# Patient Record
Sex: Female | Born: 1935 | Race: White | Hispanic: No | Smoking: Never smoker
Health system: Southern US, Community
[De-identification: ages and names within clinical notes are randomized; demographics above are authoritative.]

## PROBLEM LIST (undated history)

## (undated) DIAGNOSIS — Z8489 Family history of other specified conditions: Secondary | ICD-10-CM

## (undated) DIAGNOSIS — Z950 Presence of cardiac pacemaker: Secondary | ICD-10-CM

## (undated) DIAGNOSIS — I2510 Atherosclerotic heart disease of native coronary artery without angina pectoris: Secondary | ICD-10-CM

## (undated) DIAGNOSIS — K219 Gastro-esophageal reflux disease without esophagitis: Secondary | ICD-10-CM

## (undated) DIAGNOSIS — I10 Essential (primary) hypertension: Secondary | ICD-10-CM

## (undated) HISTORY — PX: PACEMAKER INSERTION: SHX728

## (undated) HISTORY — PX: CARDIAC CATHETERIZATION: SHX172

## (undated) HISTORY — PX: CATARACT EXTRACTION: SUR2

## (undated) HISTORY — PX: LASIK: SHX215

## (undated) HISTORY — PX: TONSILLECTOMY: SUR1361

## (undated) HISTORY — PX: ESOPHAGEAL DILATION: SHX303

## (undated) HISTORY — PX: KNEE ARTHROSCOPY: SUR90

---

## 2004-02-09 LAB — HM PAP SMEAR: HM Pap smear: NORMAL

## 2005-08-08 ENCOUNTER — Ambulatory Visit: Payer: Self-pay | Admitting: Family Medicine

## 2006-02-08 ENCOUNTER — Ambulatory Visit: Payer: Self-pay | Admitting: Specialist

## 2006-04-29 ENCOUNTER — Ambulatory Visit: Payer: Self-pay | Admitting: Unknown Physician Specialty

## 2006-04-29 LAB — HM COLONOSCOPY

## 2006-11-21 ENCOUNTER — Ambulatory Visit: Payer: Self-pay | Admitting: Family Medicine

## 2006-11-21 LAB — HM DEXA SCAN: HM DEXA SCAN: NORMAL

## 2007-05-26 ENCOUNTER — Ambulatory Visit: Payer: Self-pay | Admitting: General Surgery

## 2007-11-27 ENCOUNTER — Ambulatory Visit: Payer: Self-pay | Admitting: General Surgery

## 2009-03-07 ENCOUNTER — Ambulatory Visit: Payer: Self-pay | Admitting: Internal Medicine

## 2009-03-09 HISTORY — PX: CORONARY ARTERY BYPASS GRAFT: SHX141

## 2010-03-01 ENCOUNTER — Ambulatory Visit: Payer: Self-pay | Admitting: Family Medicine

## 2010-04-26 ENCOUNTER — Ambulatory Visit: Payer: Self-pay | Admitting: Family Medicine

## 2010-04-26 LAB — HM MAMMOGRAPHY: HM Mammogram: NORMAL

## 2011-01-31 ENCOUNTER — Ambulatory Visit: Payer: Self-pay | Admitting: Ophthalmology

## 2011-01-31 DIAGNOSIS — I119 Hypertensive heart disease without heart failure: Secondary | ICD-10-CM

## 2011-04-10 ENCOUNTER — Ambulatory Visit: Payer: Self-pay | Admitting: Ophthalmology

## 2011-04-23 ENCOUNTER — Ambulatory Visit: Payer: Self-pay | Admitting: Ophthalmology

## 2011-07-17 ENCOUNTER — Ambulatory Visit: Payer: Self-pay | Admitting: Family Medicine

## 2011-11-26 DIAGNOSIS — N390 Urinary tract infection, site not specified: Secondary | ICD-10-CM | POA: Diagnosis not present

## 2011-12-04 DIAGNOSIS — R5381 Other malaise: Secondary | ICD-10-CM | POA: Diagnosis not present

## 2011-12-04 DIAGNOSIS — R5383 Other fatigue: Secondary | ICD-10-CM | POA: Diagnosis not present

## 2011-12-04 DIAGNOSIS — I2510 Atherosclerotic heart disease of native coronary artery without angina pectoris: Secondary | ICD-10-CM | POA: Diagnosis not present

## 2011-12-04 DIAGNOSIS — I119 Hypertensive heart disease without heart failure: Secondary | ICD-10-CM | POA: Diagnosis not present

## 2011-12-04 DIAGNOSIS — E782 Mixed hyperlipidemia: Secondary | ICD-10-CM | POA: Diagnosis not present

## 2011-12-26 DIAGNOSIS — H40009 Preglaucoma, unspecified, unspecified eye: Secondary | ICD-10-CM | POA: Diagnosis not present

## 2011-12-31 DIAGNOSIS — Z79899 Other long term (current) drug therapy: Secondary | ICD-10-CM | POA: Diagnosis not present

## 2011-12-31 DIAGNOSIS — R5381 Other malaise: Secondary | ICD-10-CM | POA: Diagnosis not present

## 2011-12-31 DIAGNOSIS — E7800 Pure hypercholesterolemia, unspecified: Secondary | ICD-10-CM | POA: Diagnosis not present

## 2012-01-09 ENCOUNTER — Ambulatory Visit: Payer: Self-pay | Admitting: Ophthalmology

## 2012-01-09 DIAGNOSIS — Z01810 Encounter for preprocedural cardiovascular examination: Secondary | ICD-10-CM | POA: Diagnosis not present

## 2012-01-09 DIAGNOSIS — H2510 Age-related nuclear cataract, unspecified eye: Secondary | ICD-10-CM | POA: Diagnosis not present

## 2012-01-09 DIAGNOSIS — I10 Essential (primary) hypertension: Secondary | ICD-10-CM | POA: Diagnosis not present

## 2012-01-09 DIAGNOSIS — Z01812 Encounter for preprocedural laboratory examination: Secondary | ICD-10-CM | POA: Diagnosis not present

## 2012-01-09 LAB — POTASSIUM: Potassium: 3.6 mmol/L (ref 3.5–5.1)

## 2012-01-21 ENCOUNTER — Ambulatory Visit: Payer: Self-pay | Admitting: Ophthalmology

## 2012-01-21 DIAGNOSIS — H269 Unspecified cataract: Secondary | ICD-10-CM | POA: Diagnosis not present

## 2012-01-21 DIAGNOSIS — E7800 Pure hypercholesterolemia, unspecified: Secondary | ICD-10-CM | POA: Diagnosis not present

## 2012-01-21 DIAGNOSIS — I2510 Atherosclerotic heart disease of native coronary artery without angina pectoris: Secondary | ICD-10-CM | POA: Diagnosis not present

## 2012-01-21 DIAGNOSIS — H2510 Age-related nuclear cataract, unspecified eye: Secondary | ICD-10-CM | POA: Diagnosis not present

## 2012-01-21 DIAGNOSIS — Z950 Presence of cardiac pacemaker: Secondary | ICD-10-CM | POA: Diagnosis not present

## 2012-01-21 DIAGNOSIS — I10 Essential (primary) hypertension: Secondary | ICD-10-CM | POA: Diagnosis not present

## 2012-01-21 DIAGNOSIS — Z79899 Other long term (current) drug therapy: Secondary | ICD-10-CM | POA: Diagnosis not present

## 2012-01-21 DIAGNOSIS — K219 Gastro-esophageal reflux disease without esophagitis: Secondary | ICD-10-CM | POA: Diagnosis not present

## 2012-01-21 DIAGNOSIS — M069 Rheumatoid arthritis, unspecified: Secondary | ICD-10-CM | POA: Diagnosis not present

## 2012-01-21 DIAGNOSIS — Z9889 Other specified postprocedural states: Secondary | ICD-10-CM | POA: Diagnosis not present

## 2012-01-21 DIAGNOSIS — G4730 Sleep apnea, unspecified: Secondary | ICD-10-CM | POA: Diagnosis not present

## 2012-01-21 DIAGNOSIS — J45909 Unspecified asthma, uncomplicated: Secondary | ICD-10-CM | POA: Diagnosis not present

## 2012-02-19 DIAGNOSIS — I442 Atrioventricular block, complete: Secondary | ICD-10-CM | POA: Diagnosis not present

## 2012-02-19 DIAGNOSIS — I059 Rheumatic mitral valve disease, unspecified: Secondary | ICD-10-CM | POA: Diagnosis not present

## 2012-02-19 DIAGNOSIS — I2510 Atherosclerotic heart disease of native coronary artery without angina pectoris: Secondary | ICD-10-CM | POA: Diagnosis not present

## 2012-02-19 DIAGNOSIS — E782 Mixed hyperlipidemia: Secondary | ICD-10-CM | POA: Diagnosis not present

## 2012-04-01 DIAGNOSIS — I442 Atrioventricular block, complete: Secondary | ICD-10-CM | POA: Diagnosis not present

## 2012-05-26 DIAGNOSIS — I10 Essential (primary) hypertension: Secondary | ICD-10-CM | POA: Diagnosis not present

## 2012-05-26 DIAGNOSIS — R079 Chest pain, unspecified: Secondary | ICD-10-CM | POA: Diagnosis not present

## 2012-05-26 DIAGNOSIS — R5381 Other malaise: Secondary | ICD-10-CM | POA: Diagnosis not present

## 2012-05-26 DIAGNOSIS — R5383 Other fatigue: Secondary | ICD-10-CM | POA: Diagnosis not present

## 2012-05-26 DIAGNOSIS — N390 Urinary tract infection, site not specified: Secondary | ICD-10-CM | POA: Diagnosis not present

## 2012-08-28 DIAGNOSIS — Z23 Encounter for immunization: Secondary | ICD-10-CM | POA: Diagnosis not present

## 2012-09-02 ENCOUNTER — Ambulatory Visit: Payer: Self-pay | Admitting: Family Medicine

## 2012-09-02 DIAGNOSIS — N390 Urinary tract infection, site not specified: Secondary | ICD-10-CM | POA: Diagnosis not present

## 2012-09-02 DIAGNOSIS — M542 Cervicalgia: Secondary | ICD-10-CM | POA: Diagnosis not present

## 2012-09-02 DIAGNOSIS — M47812 Spondylosis without myelopathy or radiculopathy, cervical region: Secondary | ICD-10-CM | POA: Diagnosis not present

## 2012-09-02 DIAGNOSIS — M5030 Other cervical disc degeneration, unspecified cervical region: Secondary | ICD-10-CM | POA: Diagnosis not present

## 2012-09-02 DIAGNOSIS — K429 Umbilical hernia without obstruction or gangrene: Secondary | ICD-10-CM | POA: Diagnosis not present

## 2012-09-02 DIAGNOSIS — M549 Dorsalgia, unspecified: Secondary | ICD-10-CM | POA: Diagnosis not present

## 2012-09-16 DIAGNOSIS — I209 Angina pectoris, unspecified: Secondary | ICD-10-CM | POA: Diagnosis not present

## 2012-09-16 DIAGNOSIS — I10 Essential (primary) hypertension: Secondary | ICD-10-CM | POA: Diagnosis not present

## 2012-09-16 DIAGNOSIS — I2510 Atherosclerotic heart disease of native coronary artery without angina pectoris: Secondary | ICD-10-CM | POA: Diagnosis not present

## 2012-09-16 DIAGNOSIS — E782 Mixed hyperlipidemia: Secondary | ICD-10-CM | POA: Diagnosis not present

## 2012-09-23 DIAGNOSIS — I442 Atrioventricular block, complete: Secondary | ICD-10-CM | POA: Diagnosis not present

## 2012-09-23 DIAGNOSIS — I209 Angina pectoris, unspecified: Secondary | ICD-10-CM | POA: Diagnosis not present

## 2012-09-30 DIAGNOSIS — I10 Essential (primary) hypertension: Secondary | ICD-10-CM | POA: Diagnosis not present

## 2012-09-30 DIAGNOSIS — E782 Mixed hyperlipidemia: Secondary | ICD-10-CM | POA: Diagnosis not present

## 2012-09-30 DIAGNOSIS — I25810 Atherosclerosis of coronary artery bypass graft(s) without angina pectoris: Secondary | ICD-10-CM | POA: Diagnosis not present

## 2012-09-30 DIAGNOSIS — I059 Rheumatic mitral valve disease, unspecified: Secondary | ICD-10-CM | POA: Diagnosis not present

## 2012-10-13 ENCOUNTER — Ambulatory Visit: Payer: Self-pay | Admitting: Family Medicine

## 2012-10-13 DIAGNOSIS — Z1231 Encounter for screening mammogram for malignant neoplasm of breast: Secondary | ICD-10-CM | POA: Diagnosis not present

## 2012-10-27 DIAGNOSIS — E7800 Pure hypercholesterolemia, unspecified: Secondary | ICD-10-CM | POA: Diagnosis not present

## 2012-10-27 DIAGNOSIS — I2510 Atherosclerotic heart disease of native coronary artery without angina pectoris: Secondary | ICD-10-CM | POA: Diagnosis not present

## 2012-10-27 DIAGNOSIS — F329 Major depressive disorder, single episode, unspecified: Secondary | ICD-10-CM | POA: Diagnosis not present

## 2012-10-27 DIAGNOSIS — I10 Essential (primary) hypertension: Secondary | ICD-10-CM | POA: Diagnosis not present

## 2012-10-30 DIAGNOSIS — E7800 Pure hypercholesterolemia, unspecified: Secondary | ICD-10-CM | POA: Diagnosis not present

## 2012-10-30 DIAGNOSIS — Z79899 Other long term (current) drug therapy: Secondary | ICD-10-CM | POA: Diagnosis not present

## 2012-10-30 DIAGNOSIS — I10 Essential (primary) hypertension: Secondary | ICD-10-CM | POA: Diagnosis not present

## 2012-10-30 DIAGNOSIS — R5381 Other malaise: Secondary | ICD-10-CM | POA: Diagnosis not present

## 2012-11-20 DIAGNOSIS — H40009 Preglaucoma, unspecified, unspecified eye: Secondary | ICD-10-CM | POA: Diagnosis not present

## 2013-02-04 DIAGNOSIS — D235 Other benign neoplasm of skin of trunk: Secondary | ICD-10-CM | POA: Diagnosis not present

## 2013-02-04 DIAGNOSIS — L723 Sebaceous cyst: Secondary | ICD-10-CM | POA: Diagnosis not present

## 2013-02-04 DIAGNOSIS — L821 Other seborrheic keratosis: Secondary | ICD-10-CM | POA: Diagnosis not present

## 2013-04-08 DIAGNOSIS — I119 Hypertensive heart disease without heart failure: Secondary | ICD-10-CM | POA: Diagnosis not present

## 2013-04-08 DIAGNOSIS — E782 Mixed hyperlipidemia: Secondary | ICD-10-CM | POA: Diagnosis not present

## 2013-04-08 DIAGNOSIS — I495 Sick sinus syndrome: Secondary | ICD-10-CM | POA: Diagnosis not present

## 2013-04-08 DIAGNOSIS — I2510 Atherosclerotic heart disease of native coronary artery without angina pectoris: Secondary | ICD-10-CM | POA: Diagnosis not present

## 2013-04-09 DIAGNOSIS — I442 Atrioventricular block, complete: Secondary | ICD-10-CM | POA: Diagnosis not present

## 2013-05-13 DIAGNOSIS — E7800 Pure hypercholesterolemia, unspecified: Secondary | ICD-10-CM | POA: Diagnosis not present

## 2013-05-13 DIAGNOSIS — I10 Essential (primary) hypertension: Secondary | ICD-10-CM | POA: Diagnosis not present

## 2013-05-13 DIAGNOSIS — R002 Palpitations: Secondary | ICD-10-CM | POA: Diagnosis not present

## 2013-05-13 DIAGNOSIS — M129 Arthropathy, unspecified: Secondary | ICD-10-CM | POA: Diagnosis not present

## 2013-05-19 DIAGNOSIS — H26499 Other secondary cataract, unspecified eye: Secondary | ICD-10-CM | POA: Diagnosis not present

## 2013-05-21 DIAGNOSIS — M1610 Unilateral primary osteoarthritis, unspecified hip: Secondary | ICD-10-CM | POA: Diagnosis not present

## 2013-09-15 DIAGNOSIS — I10 Essential (primary) hypertension: Secondary | ICD-10-CM | POA: Diagnosis not present

## 2013-09-15 DIAGNOSIS — I2510 Atherosclerotic heart disease of native coronary artery without angina pectoris: Secondary | ICD-10-CM | POA: Diagnosis not present

## 2013-09-15 DIAGNOSIS — E785 Hyperlipidemia, unspecified: Secondary | ICD-10-CM | POA: Diagnosis not present

## 2013-09-15 DIAGNOSIS — Z950 Presence of cardiac pacemaker: Secondary | ICD-10-CM | POA: Diagnosis not present

## 2013-09-17 DIAGNOSIS — Z23 Encounter for immunization: Secondary | ICD-10-CM | POA: Diagnosis not present

## 2013-09-23 ENCOUNTER — Ambulatory Visit: Payer: Self-pay | Admitting: Family Medicine

## 2013-09-23 DIAGNOSIS — M5030 Other cervical disc degeneration, unspecified cervical region: Secondary | ICD-10-CM | POA: Diagnosis not present

## 2013-09-23 DIAGNOSIS — E7800 Pure hypercholesterolemia, unspecified: Secondary | ICD-10-CM | POA: Diagnosis not present

## 2013-09-23 DIAGNOSIS — M129 Arthropathy, unspecified: Secondary | ICD-10-CM | POA: Diagnosis not present

## 2013-09-23 DIAGNOSIS — I10 Essential (primary) hypertension: Secondary | ICD-10-CM | POA: Diagnosis not present

## 2013-09-23 DIAGNOSIS — M542 Cervicalgia: Secondary | ICD-10-CM | POA: Diagnosis not present

## 2013-09-23 DIAGNOSIS — Z0000 Encounter for general adult medical examination without abnormal findings: Secondary | ICD-10-CM | POA: Diagnosis not present

## 2013-09-29 DIAGNOSIS — I442 Atrioventricular block, complete: Secondary | ICD-10-CM | POA: Diagnosis not present

## 2013-10-20 ENCOUNTER — Ambulatory Visit: Payer: Self-pay | Admitting: Family Medicine

## 2013-10-20 DIAGNOSIS — Z1231 Encounter for screening mammogram for malignant neoplasm of breast: Secondary | ICD-10-CM | POA: Diagnosis not present

## 2013-10-26 DIAGNOSIS — M129 Arthropathy, unspecified: Secondary | ICD-10-CM | POA: Diagnosis not present

## 2013-10-26 DIAGNOSIS — I10 Essential (primary) hypertension: Secondary | ICD-10-CM | POA: Diagnosis not present

## 2013-10-26 DIAGNOSIS — N390 Urinary tract infection, site not specified: Secondary | ICD-10-CM | POA: Diagnosis not present

## 2013-10-26 DIAGNOSIS — E7800 Pure hypercholesterolemia, unspecified: Secondary | ICD-10-CM | POA: Diagnosis not present

## 2013-10-28 DIAGNOSIS — N318 Other neuromuscular dysfunction of bladder: Secondary | ICD-10-CM | POA: Diagnosis not present

## 2013-10-28 DIAGNOSIS — I10 Essential (primary) hypertension: Secondary | ICD-10-CM | POA: Diagnosis not present

## 2013-10-28 DIAGNOSIS — IMO0002 Reserved for concepts with insufficient information to code with codable children: Secondary | ICD-10-CM | POA: Diagnosis not present

## 2013-10-28 DIAGNOSIS — E7800 Pure hypercholesterolemia, unspecified: Secondary | ICD-10-CM | POA: Diagnosis not present

## 2013-11-05 DIAGNOSIS — L299 Pruritus, unspecified: Secondary | ICD-10-CM | POA: Diagnosis not present

## 2013-11-26 DIAGNOSIS — M129 Arthropathy, unspecified: Secondary | ICD-10-CM | POA: Diagnosis not present

## 2013-11-26 DIAGNOSIS — I10 Essential (primary) hypertension: Secondary | ICD-10-CM | POA: Diagnosis not present

## 2013-11-26 DIAGNOSIS — E7800 Pure hypercholesterolemia, unspecified: Secondary | ICD-10-CM | POA: Diagnosis not present

## 2013-11-26 DIAGNOSIS — N390 Urinary tract infection, site not specified: Secondary | ICD-10-CM | POA: Diagnosis not present

## 2014-01-27 DIAGNOSIS — I442 Atrioventricular block, complete: Secondary | ICD-10-CM | POA: Insufficient documentation

## 2014-01-27 DIAGNOSIS — Z950 Presence of cardiac pacemaker: Secondary | ICD-10-CM | POA: Insufficient documentation

## 2014-02-02 DIAGNOSIS — L821 Other seborrheic keratosis: Secondary | ICD-10-CM | POA: Diagnosis not present

## 2014-02-02 DIAGNOSIS — L259 Unspecified contact dermatitis, unspecified cause: Secondary | ICD-10-CM | POA: Diagnosis not present

## 2014-03-30 DIAGNOSIS — I25810 Atherosclerosis of coronary artery bypass graft(s) without angina pectoris: Secondary | ICD-10-CM | POA: Diagnosis not present

## 2014-03-30 DIAGNOSIS — I442 Atrioventricular block, complete: Secondary | ICD-10-CM | POA: Diagnosis not present

## 2014-03-30 DIAGNOSIS — I10 Essential (primary) hypertension: Secondary | ICD-10-CM | POA: Diagnosis not present

## 2014-03-30 DIAGNOSIS — K219 Gastro-esophageal reflux disease without esophagitis: Secondary | ICD-10-CM | POA: Diagnosis not present

## 2014-05-16 DIAGNOSIS — B029 Zoster without complications: Secondary | ICD-10-CM | POA: Diagnosis not present

## 2014-06-03 DIAGNOSIS — B029 Zoster without complications: Secondary | ICD-10-CM | POA: Diagnosis not present

## 2014-06-03 DIAGNOSIS — I10 Essential (primary) hypertension: Secondary | ICD-10-CM | POA: Diagnosis not present

## 2014-06-03 DIAGNOSIS — E7800 Pure hypercholesterolemia, unspecified: Secondary | ICD-10-CM | POA: Diagnosis not present

## 2014-06-03 DIAGNOSIS — I2510 Atherosclerotic heart disease of native coronary artery without angina pectoris: Secondary | ICD-10-CM | POA: Diagnosis not present

## 2014-07-15 DIAGNOSIS — H26499 Other secondary cataract, unspecified eye: Secondary | ICD-10-CM | POA: Diagnosis not present

## 2014-08-26 DIAGNOSIS — Z23 Encounter for immunization: Secondary | ICD-10-CM | POA: Diagnosis not present

## 2014-09-30 DIAGNOSIS — E782 Mixed hyperlipidemia: Secondary | ICD-10-CM | POA: Diagnosis not present

## 2014-09-30 DIAGNOSIS — I340 Nonrheumatic mitral (valve) insufficiency: Secondary | ICD-10-CM | POA: Insufficient documentation

## 2014-09-30 DIAGNOSIS — I442 Atrioventricular block, complete: Secondary | ICD-10-CM | POA: Diagnosis not present

## 2014-09-30 DIAGNOSIS — I25709 Atherosclerosis of coronary artery bypass graft(s), unspecified, with unspecified angina pectoris: Secondary | ICD-10-CM | POA: Diagnosis not present

## 2014-09-30 DIAGNOSIS — G4733 Obstructive sleep apnea (adult) (pediatric): Secondary | ICD-10-CM | POA: Insufficient documentation

## 2014-09-30 DIAGNOSIS — Z950 Presence of cardiac pacemaker: Secondary | ICD-10-CM | POA: Diagnosis not present

## 2014-09-30 DIAGNOSIS — I10 Essential (primary) hypertension: Secondary | ICD-10-CM | POA: Diagnosis not present

## 2014-10-04 DIAGNOSIS — F329 Major depressive disorder, single episode, unspecified: Secondary | ICD-10-CM | POA: Diagnosis not present

## 2014-10-04 DIAGNOSIS — R413 Other amnesia: Secondary | ICD-10-CM | POA: Diagnosis not present

## 2014-10-04 DIAGNOSIS — R5383 Other fatigue: Secondary | ICD-10-CM | POA: Diagnosis not present

## 2014-10-04 DIAGNOSIS — I10 Essential (primary) hypertension: Secondary | ICD-10-CM | POA: Diagnosis not present

## 2014-10-04 DIAGNOSIS — E780 Pure hypercholesterolemia: Secondary | ICD-10-CM | POA: Diagnosis not present

## 2014-10-04 DIAGNOSIS — M810 Age-related osteoporosis without current pathological fracture: Secondary | ICD-10-CM | POA: Diagnosis not present

## 2014-10-04 DIAGNOSIS — Z23 Encounter for immunization: Secondary | ICD-10-CM | POA: Diagnosis not present

## 2014-10-04 LAB — CBC AND DIFFERENTIAL
HEMATOCRIT: 37 % (ref 36–46)
Hemoglobin: 12.9 g/dL (ref 12.0–16.0)
Neutrophils Absolute: 3 /uL
Platelets: 292 10*3/uL (ref 150–399)
WBC: 6.3 10^3/mL

## 2014-10-04 LAB — BASIC METABOLIC PANEL
BUN: 15 mg/dL (ref 4–21)
Creatinine: 0.5 mg/dL (ref 0.5–1.1)
Glucose: 101 mg/dL
POTASSIUM: 3.7 mmol/L (ref 3.4–5.3)
Sodium: 141 mmol/L (ref 137–147)

## 2014-10-04 LAB — LIPID PANEL
CHOLESTEROL: 188 mg/dL (ref 0–200)
HDL: 75 mg/dL — AB (ref 35–70)
LDL CALC: 90 mg/dL
LDL/HDL RATIO: 1.2
Triglycerides: 116 mg/dL (ref 40–160)

## 2014-10-04 LAB — HEPATIC FUNCTION PANEL
ALT: 18 U/L (ref 7–35)
AST: 24 U/L (ref 13–35)
Alkaline Phosphatase: 66 U/L (ref 25–125)

## 2014-10-04 LAB — TSH: TSH: 0.96 u[IU]/mL (ref 0.41–5.90)

## 2014-10-27 DIAGNOSIS — E782 Mixed hyperlipidemia: Secondary | ICD-10-CM | POA: Diagnosis not present

## 2014-10-27 DIAGNOSIS — Z950 Presence of cardiac pacemaker: Secondary | ICD-10-CM | POA: Diagnosis not present

## 2014-10-27 DIAGNOSIS — I25810 Atherosclerosis of coronary artery bypass graft(s) without angina pectoris: Secondary | ICD-10-CM | POA: Diagnosis not present

## 2014-10-27 DIAGNOSIS — I340 Nonrheumatic mitral (valve) insufficiency: Secondary | ICD-10-CM | POA: Diagnosis not present

## 2014-10-27 DIAGNOSIS — I10 Essential (primary) hypertension: Secondary | ICD-10-CM | POA: Diagnosis not present

## 2014-10-27 DIAGNOSIS — R0789 Other chest pain: Secondary | ICD-10-CM | POA: Diagnosis not present

## 2014-11-02 ENCOUNTER — Ambulatory Visit: Payer: Self-pay | Admitting: Family Medicine

## 2014-11-02 DIAGNOSIS — Z1231 Encounter for screening mammogram for malignant neoplasm of breast: Secondary | ICD-10-CM | POA: Diagnosis not present

## 2015-01-26 DIAGNOSIS — Z961 Presence of intraocular lens: Secondary | ICD-10-CM | POA: Diagnosis not present

## 2015-01-26 DIAGNOSIS — H26493 Other secondary cataract, bilateral: Secondary | ICD-10-CM | POA: Diagnosis not present

## 2015-02-03 DIAGNOSIS — L728 Other follicular cysts of the skin and subcutaneous tissue: Secondary | ICD-10-CM | POA: Diagnosis not present

## 2015-02-03 DIAGNOSIS — L821 Other seborrheic keratosis: Secondary | ICD-10-CM | POA: Diagnosis not present

## 2015-03-13 NOTE — Op Note (Signed)
PATIENT NAME:  Melanie Franklin, Melanie Franklin MR#:  [id number] DATE OF BIRTH:  1929-04-11  DATE OF PROCEDURE:  01/21/2012  PREOPERATIVE DIAGNOSIS: Cataract, right eye.   POSTOPERATIVE DIAGNOSIS: Cataract, right eye.   PROCEDURE PERFORMED: Extracapsular cataract extraction using phacoemulsification with placement of an Alcon [id number], 17.5-diopter posterior chamber lens, serial #[id number].   SURGEON: Loura Back. Lalana Wachter, M.D.   ASSISTANT: None.   ANESTHESIA: 4% lidocaine and 0.75% Marcaine in a 50-50 mixture with 10 units/mL of Vitrase added, given as a peribulbar.   ANESTHESIOLOGIST: Dr. Benjamine Mola  COMPLICATIONS: None.   ESTIMATED BLOOD LOSS: Less than 1 mL.   DESCRIPTION OF PROCEDURE:  The patient was brought to the operating room and given a peribulbar block.  The patient was then prepped and draped in the usual fashion.  The vertical rectus muscles were imbricated using 5-0 silk sutures.  These sutures were then clamped to the sterile drapes as bridle sutures.  A limbal peritomy was performed extending two clock hours and hemostasis was obtained with cautery.  A partial thickness scleral groove was made at the surgical limbus and dissected anteriorly in a lamellar dissection using an Alcon crescent knife.  The anterior chamber was entered superonasally with a Superblade and through the lamellar dissection with a 2.6 mm keratome.  DisCoVisc was used to replace the aqueous and a continuous tear capsulorrhexis was carried out.  Hydrodissection and hydrodelineation were carried out with balanced salt and a 27 gauge canula.  The nucleus was rotated to confirm the effectiveness of the hydrodissection.  Phacoemulsification was carried out using a divide-and-conquer technique.  Total ultrasound time was 3 minutes and 16 seconds with an average power of 25.7 percent.  CDE 72.67.  Irrigation/aspiration was used to remove the residual cortex.  DisCoVisc was used to inflate the capsule and the internal incision was  enlarged to 3 mm with the crescent knife.  The intraocular lens was folded and inserted into the capsular bag using the Acrysert delivery system.  Irrigation/aspiration was used to remove the residual DisCoVisc.  Miostat was injected into the anterior chamber through the paracentesis track to inflate the anterior chamber and induce miosis.  The wound was checked for leaks and none were found. The conjunctiva was closed with cautery and the bridle sutures were removed.  Two drops of 0.3% Vigamox were placed on the eye.   An eye shield was placed on the eye.  The patient was discharged to the recovery room in good condition.   ____________________________ Loura Back Cataleya Cristina, MD sad:bjt D: 01/21/2012 13:09:04 ET T: 01/21/2012 13:47:42 ET JOB#: [id number]  cc: Remo Lipps A. Breean Nannini, MD, <Dictator> Martie Lee MD ELECTRONICALLY SIGNED 01/22/2012 14:18

## 2015-03-15 DIAGNOSIS — Z23 Encounter for immunization: Secondary | ICD-10-CM | POA: Diagnosis not present

## 2015-03-15 DIAGNOSIS — R413 Other amnesia: Secondary | ICD-10-CM | POA: Diagnosis not present

## 2015-03-15 DIAGNOSIS — Z1389 Encounter for screening for other disorder: Secondary | ICD-10-CM | POA: Diagnosis not present

## 2015-03-15 DIAGNOSIS — M810 Age-related osteoporosis without current pathological fracture: Secondary | ICD-10-CM | POA: Diagnosis not present

## 2015-03-15 DIAGNOSIS — Z0000 Encounter for general adult medical examination without abnormal findings: Secondary | ICD-10-CM | POA: Diagnosis not present

## 2015-03-15 DIAGNOSIS — F329 Major depressive disorder, single episode, unspecified: Secondary | ICD-10-CM | POA: Diagnosis not present

## 2015-04-28 DIAGNOSIS — I442 Atrioventricular block, complete: Secondary | ICD-10-CM | POA: Diagnosis not present

## 2015-05-04 DIAGNOSIS — G4733 Obstructive sleep apnea (adult) (pediatric): Secondary | ICD-10-CM | POA: Diagnosis not present

## 2015-05-04 DIAGNOSIS — I872 Venous insufficiency (chronic) (peripheral): Secondary | ICD-10-CM | POA: Diagnosis not present

## 2015-05-04 DIAGNOSIS — I071 Rheumatic tricuspid insufficiency: Secondary | ICD-10-CM | POA: Insufficient documentation

## 2015-05-04 DIAGNOSIS — I25708 Atherosclerosis of coronary artery bypass graft(s), unspecified, with other forms of angina pectoris: Secondary | ICD-10-CM | POA: Diagnosis not present

## 2015-05-18 DIAGNOSIS — I10 Essential (primary) hypertension: Secondary | ICD-10-CM | POA: Insufficient documentation

## 2015-05-18 DIAGNOSIS — M5030 Other cervical disc degeneration, unspecified cervical region: Secondary | ICD-10-CM | POA: Insufficient documentation

## 2015-05-18 DIAGNOSIS — M1990 Unspecified osteoarthritis, unspecified site: Secondary | ICD-10-CM | POA: Insufficient documentation

## 2015-05-18 DIAGNOSIS — G5600 Carpal tunnel syndrome, unspecified upper limb: Secondary | ICD-10-CM | POA: Insufficient documentation

## 2015-05-18 DIAGNOSIS — N3281 Overactive bladder: Secondary | ICD-10-CM | POA: Insufficient documentation

## 2015-05-18 DIAGNOSIS — I498 Other specified cardiac arrhythmias: Secondary | ICD-10-CM | POA: Insufficient documentation

## 2015-05-18 DIAGNOSIS — B029 Zoster without complications: Secondary | ICD-10-CM | POA: Insufficient documentation

## 2015-05-18 DIAGNOSIS — F329 Major depressive disorder, single episode, unspecified: Secondary | ICD-10-CM | POA: Insufficient documentation

## 2015-05-18 DIAGNOSIS — G4730 Sleep apnea, unspecified: Secondary | ICD-10-CM | POA: Insufficient documentation

## 2015-05-18 DIAGNOSIS — F32A Depression, unspecified: Secondary | ICD-10-CM | POA: Insufficient documentation

## 2015-05-18 DIAGNOSIS — G2581 Restless legs syndrome: Secondary | ICD-10-CM | POA: Insufficient documentation

## 2015-05-18 DIAGNOSIS — K589 Irritable bowel syndrome without diarrhea: Secondary | ICD-10-CM | POA: Insufficient documentation

## 2015-05-18 DIAGNOSIS — E7800 Pure hypercholesterolemia, unspecified: Secondary | ICD-10-CM | POA: Insufficient documentation

## 2015-05-18 DIAGNOSIS — K429 Umbilical hernia without obstruction or gangrene: Secondary | ICD-10-CM | POA: Insufficient documentation

## 2015-05-18 DIAGNOSIS — K219 Gastro-esophageal reflux disease without esophagitis: Secondary | ICD-10-CM | POA: Insufficient documentation

## 2015-05-18 DIAGNOSIS — I2510 Atherosclerotic heart disease of native coronary artery without angina pectoris: Secondary | ICD-10-CM | POA: Insufficient documentation

## 2015-05-18 DIAGNOSIS — M19049 Primary osteoarthritis, unspecified hand: Secondary | ICD-10-CM | POA: Insufficient documentation

## 2015-05-24 DIAGNOSIS — H3562 Retinal hemorrhage, left eye: Secondary | ICD-10-CM | POA: Diagnosis not present

## 2015-07-11 ENCOUNTER — Ambulatory Visit (INDEPENDENT_AMBULATORY_CARE_PROVIDER_SITE_OTHER): Payer: Medicare Other | Admitting: Family Medicine

## 2015-07-11 ENCOUNTER — Encounter: Payer: Self-pay | Admitting: Family Medicine

## 2015-07-11 VITALS — BP 148/60 | HR 78 | Temp 97.8°F | Resp 16 | Wt 137.0 lb

## 2015-07-11 DIAGNOSIS — I10 Essential (primary) hypertension: Secondary | ICD-10-CM | POA: Diagnosis not present

## 2015-07-11 DIAGNOSIS — K219 Gastro-esophageal reflux disease without esophagitis: Secondary | ICD-10-CM | POA: Diagnosis not present

## 2015-07-11 DIAGNOSIS — R413 Other amnesia: Secondary | ICD-10-CM | POA: Diagnosis not present

## 2015-07-11 MED ORDER — OMEPRAZOLE 20 MG PO CPDR: 20.0000 mg | DELAYED_RELEASE_CAPSULE | Freq: Every day | ORAL | Status: DC

## 2015-07-11 NOTE — Progress Notes (Signed)
Patient ID: Melanie Franklin, female   DOB: [birth year], 81 y.o.   MRN: [id number]    Subjective:  HPI  Hypertension, follow-up:  BP Readings from Last 3 Encounters:  07/11/15 148/60  03/15/15 152/64    She was last seen for hypertension 6 months ago.  BP at that visit was 152/64. Management changes since that visit include none. She reports good compliance with treatment. She is not having side effects.  She is exercising, yard work and stays busy. She is adherent to low salt diet.   Outside blood pressures are not being checked regularly. She is experiencing none.  Patient denies none.       Wt Readings from Last 3 Encounters:  07/11/15 137 lb (62.143 kg)  03/15/15 139 lb (63.05 kg)   ------------------------------------------------------------------------  According to LOV note pt scored a 10 on her CIT 6. Needed to be addressed on this OV. Pt reports that her memory is ok for a 81 year old women, it is not any worse or any better.  Cognitive Testing - 6-CIT  Correct? Score   What year is it? yes 0 0 or 4  What month is it? yes 0 0 or 3  Memorize:    Melanie Franklin,  44,  [place],      What time is it? (within 1 hour) yes 0 0 or 3  Count backwards from 20 yes 0 0, 2, or 4  Name the months of the year yes 0 0, 2, or 4  Repeat name & address above no 6 0, 2, 4, 6, 8, or 10       TOTAL SCORE  6/28   Interpretation:  Normal  Normal (0-7) Abnormal (8-28)        Prior to Admission medications   Medication Sig Start Date End Date Taking? Authorizing Provider  acetaminophen (TYLENOL) 650 MG CR tablet Take by mouth.   Yes Historical Provider, MD  amLODipine (NORVASC) 5 MG tablet Take by mouth. 03/29/15  Yes Historical Provider, MD  aspirin 81 MG tablet Take by mouth. 09/18/11  Yes Historical Provider, MD  Calcium-Magnesium-Vitamin D (CALCIUM 500 PO) Take by mouth.   Yes Historical Provider, MD  magnesium oxide (MAG-OX) 400 MG tablet Take by mouth. 09/18/11  Yes  Historical Provider, MD  Misc Natural Products (GLUCOSAMINE CHONDROITIN COMPLX) TABS Take by mouth.   Yes Historical Provider, MD  MULTIPLE VITAMINS PO Take by mouth. 09/18/11  Yes Historical Provider, MD  omeprazole (PRILOSEC) 20 MG capsule Take by mouth. 09/18/11  Yes Historical Provider, MD  pravastatin (PRAVACHOL) 40 MG tablet Take by mouth. 04/04/15  Yes Historical Provider, MD  quinapril-hydrochlorothiazide (ACCURETIC) 20-12.5 MG per tablet Take by mouth. 01/25/15  Yes Historical Provider, MD    Patient Active Problem List   Diagnosis Date Noted  . Arthritis 05/18/2015  . Arthropathy of hand 05/18/2015  . CAD in native artery 05/18/2015  . Carpal tunnel syndrome 05/18/2015  . DDD (degenerative disc disease), cervical 05/18/2015  . Clinical depression 05/18/2015  . Gastro-esophageal reflux disease without esophagitis 05/18/2015  . Essential (primary) hypertension 05/18/2015  . Herpes zona 05/18/2015  . Adaptive colitis 05/18/2015  . Hypercholesteremia 05/18/2015  . 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 Fluttering heart 05/18/2015  . Restless leg 05/18/2015  . Apnea, sleep 05/18/2015  . Umbilical hernia without obstruction and without gangrene 05/18/2015  . TI (tricuspid incompetence) 05/04/2015  . Obstructive apnea 09/30/2014  . MI (mitral incompetence) 09/30/2014  . Artificial  cardiac pacemaker 01/27/2014  . Acquired complete AV block 01/27/2014    History reviewed. No pertinent past medical history.  Social History   Social History  . Marital Status: Married    Spouse Name: N/A  . Number of Children: N/A  . Years of Education: N/A   Occupational History  . Not on file.   Social History Main Topics  . Smoking status: Never Smoker   . Smokeless tobacco: Not on file  . Alcohol Use: No  . Drug Use: No  . Sexual Activity: Not on file   Other Topics Concern  . Not on file   Social History Narrative    Allergies  Allergen Reactions  . Ciprofloxacin   .  Meloxicam   . Amoxicillin Swelling and Rash  . Entex Lq  [Phenylephrine-Guaifenesin] Swelling and Rash    Review of Systems  Constitutional: Positive for malaise/fatigue.  HENT: Negative.   Eyes: Negative.   Respiratory: Negative.   Cardiovascular: Positive for chest pain (every now and then. She has checked by her Cardiologist since it started.).  Gastrointestinal: Negative.   Genitourinary: Negative.   Musculoskeletal: Negative.   Skin: Negative.   Neurological: Negative.   Endo/Heme/Allergies: Negative.   Psychiatric/Behavioral: Negative.     Immunization History  Administered Date(s) Administered  . Pneumococcal Conjugate-13 10/04/2014  . Pneumococcal Polysaccharide-23 09/22/2005  . Td 02/09/2004  . Zoster 08/09/2008   Objective:  BP 148/60 mmHg  Pulse 78  Temp(Src) 97.8 F (36.6 C) (Oral)  Resp 16  Wt 137 lb (62.143 kg)  Physical Exam  Constitutional: She is oriented to person, place, and time and well-developed, well-nourished, and in no distress.  HENT:  Head: Normocephalic and atraumatic.  Right Ear: External ear normal.  Left Ear: External ear normal.  Nose: Nose normal.  Eyes: Conjunctivae and EOM are normal. Pupils are equal, round, and reactive to light.  Neck: Normal range of motion. Neck supple.  Cardiovascular: Normal rate, regular rhythm, normal heart sounds and intact distal pulses.   Pulmonary/Chest: Effort normal and breath sounds normal.  Abdominal: Soft. Bowel sounds are normal.  Neurological: She is alert and oriented to person, place, and time. She has normal reflexes. Gait normal. GCS score is 15.  Skin: Skin is warm and dry.  Psychiatric: Mood, memory, affect and judgment normal.    Lab Results  Component Value Date   WBC 6.3 10/04/2014   HGB 12.9 10/04/2014   HCT 37 10/04/2014   PLT 292 10/04/2014   CHOL 188 10/04/2014   TRIG 116 10/04/2014   HDL 75* 10/04/2014   LDLCALC 90 10/04/2014   TSH 0.96 10/04/2014    CMP       Component Value Date/Time   NA 141 10/04/2014   K 3.7 10/04/2014   K 3.6 01/09/2012 1010   BUN 15 10/04/2014   CREATININE 0.5 10/04/2014   AST 24 10/04/2014   ALT 18 10/04/2014   ALKPHOS 66 10/04/2014    Assessment and Plan :  ASSESSMENT: 1. Gastro-esophageal reflux disease without esophagitis  - omeprazole (PRILOSEC) 20 MG capsule; Take 1 capsule (20 mg total) by mouth daily.  Dispense: 30 capsule; Refill: 12  2. Memory loss or impairment She score 6/28 today which is improved from last visit. I think she is having some age-related memory loss but this is mild. She just driving and cooking. He is not having problems with getting lost or with leaving the oven on. Of note is she is taking care of her sister  who has oral cancer and is recovering. This is been going on for 4 months and the patient feels good about this.  3. Essential (primary) hypertension 4. CAD without angina All risk factors treated. Patient was seen and examined by Dr. Miguel Aschoff, and noted scribed by Webb Laws, [place] MD [facility] 07/11/2015 9:39 AM

## 2015-08-01 DIAGNOSIS — Z23 Encounter for immunization: Secondary | ICD-10-CM | POA: Diagnosis not present

## 2015-08-23 DIAGNOSIS — H3562 Retinal hemorrhage, left eye: Secondary | ICD-10-CM | POA: Diagnosis not present

## 2015-09-21 ENCOUNTER — Emergency Department
Admission: EM | Admit: 2015-09-21 | Discharge: 2015-09-21 | Disposition: A | Discharge status: Home / Self Care | Payer: Medicare Other | Attending: Emergency Medicine | Admitting: Emergency Medicine

## 2015-09-21 ENCOUNTER — Emergency Department: Payer: Medicare Other

## 2015-09-21 DIAGNOSIS — Z7982 Long term (current) use of aspirin: Secondary | ICD-10-CM | POA: Diagnosis not present

## 2015-09-21 DIAGNOSIS — S42292A Other displaced fracture of upper end of left humerus, initial encounter for closed fracture: Secondary | ICD-10-CM | POA: Insufficient documentation

## 2015-09-21 DIAGNOSIS — I10 Essential (primary) hypertension: Secondary | ICD-10-CM | POA: Diagnosis not present

## 2015-09-21 DIAGNOSIS — S42202A Unspecified fracture of upper end of left humerus, initial encounter for closed fracture: Secondary | ICD-10-CM

## 2015-09-21 DIAGNOSIS — Y998 Other external cause status: Secondary | ICD-10-CM | POA: Insufficient documentation

## 2015-09-21 DIAGNOSIS — Y92009 Unspecified place in unspecified non-institutional (private) residence as the place of occurrence of the external cause: Secondary | ICD-10-CM | POA: Diagnosis not present

## 2015-09-21 DIAGNOSIS — Y9389 Activity, other specified: Secondary | ICD-10-CM | POA: Diagnosis not present

## 2015-09-21 DIAGNOSIS — W109XXA Fall (on) (from) unspecified stairs and steps, initial encounter: Secondary | ICD-10-CM | POA: Diagnosis not present

## 2015-09-21 DIAGNOSIS — Z880 Allergy status to penicillin: Secondary | ICD-10-CM | POA: Diagnosis not present

## 2015-09-21 DIAGNOSIS — Z79899 Other long term (current) drug therapy: Secondary | ICD-10-CM | POA: Insufficient documentation

## 2015-09-21 DIAGNOSIS — S4992XA Unspecified injury of left shoulder and upper arm, initial encounter: Secondary | ICD-10-CM | POA: Diagnosis present

## 2015-09-21 MED ORDER — TRAMADOL HCL 50 MG PO TABS: 50.0000 mg | ORAL_TABLET | Freq: Four times a day (QID) | ORAL | Status: DC | PRN

## 2015-09-21 MED ORDER — TRAMADOL HCL 50 MG PO TABS
ORAL_TABLET | ORAL | Status: AC
  Filled 2015-09-21: qty 1

## 2015-09-21 MED ORDER — TRAMADOL HCL 50 MG PO TABS
50.0000 mg | ORAL_TABLET | Freq: Once | ORAL | Status: AC
  Administered 2015-09-21: 50 mg via ORAL

## 2015-09-21 NOTE — Discharge Instructions (Signed)
Call tomorrow to schedule an appointment with orthopedics.  Humerus Fracture Treated With Immobilization The humerus is the large bone in your upper arm. You have a broken (fractured) humerus. These fractures are easily diagnosed with X-rays. TREATMENT  Simple fractures which will heal without disability are treated with simple immobilization. Immobilization means you will wear a cast, splint, or sling. You have a fracture which will do well with immobilization. The fracture will heal well simply by being held in a good position until it is stable enough to begin range of motion exercises. Do not take part in activities which would further injure your arm.  HOME CARE INSTRUCTIONS   Put ice on the injured area.  Put ice in a plastic bag.  Place a towel between your skin and the bag.  Leave the ice on for 15-20 minutes, 03-04 times a day.  If you have a cast:  Do not scratch the skin under the cast using sharp or pointed objects.  Check the skin around the cast every day. You may put lotion on any red or sore areas.  Keep your cast dry and clean.  If you have a splint:  Wear the splint as directed.  Keep your splint dry and clean.  You may loosen the elastic around the splint if your fingers become numb, tingle, or turn cold or blue.  If you have a sling:  Wear the sling as directed.  Do not put pressure on any part of your cast or splint until it is fully hardened.  Your cast or splint can be protected during bathing with a plastic bag. Do not lower the cast or splint into water.  Only take over-the-counter or prescription medicines for pain, discomfort, or fever as directed by your caregiver.  Do range of motion exercises as instructed by your caregiver.  Follow up as directed by your caregiver. This is very important in order to avoid permanent injury or disability and chronic pain. SEEK IMMEDIATE MEDICAL CARE IF:   Your skin or nails in the injured arm turn blue or  gray.  Your arm feels cold or numb.  You develop severe pain in the injured arm.  You are having problems with the medicines you were given. MAKE SURE YOU:   Understand these instructions.  Will watch your condition.  Will get help right away if you are not doing well or get worse.   This information is not intended to replace advice given to you by your health care provider. Make sure you discuss any questions you have with your health care provider.   Document Released: 02/11/2001 Document Revised: 11/26/2014 Document Reviewed: 03/30/2015 Elsevier Interactive Patient Education [employer].

## 2015-09-21 NOTE — ED Notes (Signed)
Pt stumbled and fell today while painting. Pt thinks she braced herself with left hand. Pt presents with left shoulder and wrist pain. Small abrasion noted to left elbow. Left thumb bruised. Bruising noted to left wrist.

## 2015-09-21 NOTE — ED Notes (Signed)
Patient was painting at outside of house and per patient "I misjudged my steps and fell into bushes onto my shoulder".  Patient complains of left shoulder pain and has bruising to left hand.

## 2015-09-21 NOTE — ED Provider Notes (Signed)
[facility] Emergency Department Provider Note ____________________________________________  Time seen: Approximately 3:50 PM  I have reviewed the triage vital signs and the nursing notes.   HISTORY  Chief Complaint Fall   HPI Melanie Franklin is a 81 y.o. female who presents to the emergency department for evaluation of pain in the left shoulder post mechanical fall at home. She was coming out of the basement and her foot caught the brick step which caused her to lose her balance and fall forward. She denies head injury. She denies neck or back pain.   History reviewed. No pertinent past medical history.  Patient Active Problem List   Diagnosis Date Noted  . Memory loss or impairment 07/11/2015  . Arthritis 05/18/2015  . Arthropathy of hand 05/18/2015  . CAD in native artery 05/18/2015  . Carpal tunnel syndrome 05/18/2015  . DDD (degenerative disc disease), cervical 05/18/2015  . Clinical depression 05/18/2015  . Gastro-esophageal reflux disease without esophagitis 05/18/2015  . Essential (primary) hypertension 05/18/2015  . Herpes zona 05/18/2015  . Adaptive colitis 05/18/2015  . Hypercholesteremia 05/18/2015  . 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 Fluttering heart ([place]) 05/18/2015  . Restless leg 05/18/2015  . Apnea, sleep 05/18/2015  . Umbilical hernia without obstruction and without gangrene 05/18/2015  . TI (tricuspid incompetence) 05/04/2015  . Obstructive apnea 09/30/2014  . MI (mitral incompetence) 09/30/2014  . Artificial cardiac pacemaker 01/27/2014  . Acquired complete AV block ([place]) 01/27/2014    Past Surgical History  Procedure Laterality Date  . Lasik    . Pacemaker insertion    . Coronary artery bypass graft  03/09/2009    x 4 [facility]  . Esophageal dilation    . Knee arthroscopy Left   . Cataract extraction Bilateral   . Tonsillectomy      Current Outpatient Rx  Name  Route  Sig  Dispense  Refill   . acetaminophen (TYLENOL) 650 MG CR tablet   Oral   Take by mouth.         Marland Kitchen amLODipine (NORVASC) 5 MG tablet   Oral   Take by mouth.         Marland Kitchen aspirin 81 MG tablet   Oral   Take by mouth.         . Calcium-Magnesium-Vitamin D (CALCIUM 500 PO)   Oral   Take by mouth.         . magnesium oxide (MAG-OX) 400 MG tablet   Oral   Take by mouth.         . Misc Natural Products (GLUCOSAMINE CHONDROITIN COMPLX) TABS   Oral   Take by mouth.         . MULTIPLE VITAMINS PO   Oral   Take by mouth.         Marland Kitchen omeprazole (PRILOSEC) 20 MG capsule   Oral   Take 1 capsule (20 mg total) by mouth daily.   30 capsule   12   . pravastatin (PRAVACHOL) 40 MG tablet   Oral   Take by mouth.         . quinapril-hydrochlorothiazide (ACCURETIC) 20-12.5 MG per tablet   Oral   Take by mouth.         . traMADol (ULTRAM) 50 MG tablet   Oral   Take 1 tablet (50 mg total) by mouth every 6 (six) hours as needed.   20 tablet   0     Allergies Ciprofloxacin; Meloxicam; Amoxicillin; and  Entex lq   Family History  Problem Relation Age of Onset  . Hypertension Mother   . Colon cancer Mother   . Cerebrovascular Accident Mother   . Ulcers Mother   . Other Mother     nervous breakdown  . Stroke Father   . Congestive Heart Failure Sister   . Kidney Stones Sister   . Cancer Sister     oral  . Hypertension Brother   . Hypertension Sister   . Diabetes Sister   . CAD Sister   . Other Sister     Social History Social History  Substance Use Topics  . Smoking status: Never Smoker   . Smokeless tobacco: None  . Alcohol Use: No    Review of Systems Constitutional: No recent illness. Eyes: No visual changes. ENT: No sore throat. Cardiovascular: Denies chest pain or palpitations. Respiratory: Denies shortness of breath. Gastrointestinal: No abdominal pain.  Genitourinary: Negative for dysuria. Musculoskeletal: Pain in left shoulder and left thumb Skin: Negative  for rash. Neurological: Negative for headaches, focal weakness or numbness. 10-point ROS otherwise negative.  ____________________________________________   PHYSICAL EXAM:  VITAL SIGNS: ED Triage Vitals  Enc Vitals Group     BP 09/21/15 1444 119/50 mmHg     Pulse Rate 09/21/15 1444 63     Resp 09/21/15 1444 18     Temp 09/21/15 1444 97.6 F (36.4 C)     Temp Source 09/21/15 1444 Oral     SpO2 09/21/15 1444 97 %     Weight 09/21/15 1444 138 lb (62.596 kg)     Height 09/21/15 1444 5\' 2"  (1.575 m)     Head Cir --      Peak Flow --      Pain Score 09/21/15 1530 10     Pain Loc --      Pain Edu? --      Excl. in [place]? --     Constitutional: Alert and oriented. Well appearing and in no acute distress. Eyes: Conjunctivae are normal. EOMI. Head: Atraumatic. Nose: No congestion/rhinnorhea. Neck: No stridor.  Respiratory: Normal respiratory effort.   Musculoskeletal: Limited abduction and extension of the left shoulder/arm. No obvious step-off deformity. Neurologic:  Normal speech and language. No gross focal neurologic deficits are appreciated. Speech is normal. No gait instability. Skin:  Skin is warm, dry and intact. Atraumatic. Psychiatric: Mood and affect are normal. Speech and behavior are normal.  ____________________________________________   LABS (all labs ordered are listed, but only abnormal results are displayed)  Labs Reviewed - No data to display ____________________________________________  RADIOLOGY  Comminuted displaced proximal left humeral fracture involving the head and surgical neck of the left humerus.  I, Sherrie George, personally viewed and evaluated these images (plain radiographs) as part of my medical decision making.   ____________________________________________   PROCEDURES  Procedure(s) performed:   Shoulder immobilizer applied by ER tech. Neurovascularly intact  post-application.   ____________________________________________   INITIAL IMPRESSION / ASSESSMENT AND PLAN / ED COURSE  Pertinent labs & imaging results that were available during my care of the patient were reviewed by me and considered in my medical decision making (see chart for details).  Case was reviewed and discussed with Dr. Mack Guise who instructed to apply immobilizer and have her follow-up in the office.  Instructions were reviewed with the patient and her family. ER return precautions were discussed. ____________________________________________   FINAL CLINICAL IMPRESSION(S) / ED DIAGNOSES  Final diagnoses:  Humeral head fracture, left, closed, initial encounter  Proximal humerus fracture, left, closed, initial encounter       Victorino Dike, FNP 09/21/15 1824  Orbie Pyo, MD 09/21/15 [id number]

## 2015-09-22 DIAGNOSIS — S42232A 3-part fracture of surgical neck of left humerus, initial encounter for closed fracture: Secondary | ICD-10-CM | POA: Diagnosis not present

## 2015-09-22 DIAGNOSIS — S42213A Unspecified displaced fracture of surgical neck of unspecified humerus, initial encounter for closed fracture: Secondary | ICD-10-CM | POA: Insufficient documentation

## 2015-09-30 DIAGNOSIS — S42232A 3-part fracture of surgical neck of left humerus, initial encounter for closed fracture: Secondary | ICD-10-CM | POA: Diagnosis not present

## 2015-10-17 ENCOUNTER — Ambulatory Visit: Payer: Medicare Other | Admitting: Family Medicine

## 2015-10-21 DIAGNOSIS — S42232A 3-part fracture of surgical neck of left humerus, initial encounter for closed fracture: Secondary | ICD-10-CM | POA: Diagnosis not present

## 2015-10-26 DIAGNOSIS — M25612 Stiffness of left shoulder, not elsewhere classified: Secondary | ICD-10-CM | POA: Diagnosis not present

## 2015-10-26 DIAGNOSIS — M25512 Pain in left shoulder: Secondary | ICD-10-CM | POA: Diagnosis not present

## 2015-10-27 DIAGNOSIS — I25708 Atherosclerosis of coronary artery bypass graft(s), unspecified, with other forms of angina pectoris: Secondary | ICD-10-CM | POA: Diagnosis not present

## 2015-10-27 DIAGNOSIS — I10 Essential (primary) hypertension: Secondary | ICD-10-CM | POA: Diagnosis not present

## 2015-10-27 DIAGNOSIS — I442 Atrioventricular block, complete: Secondary | ICD-10-CM | POA: Diagnosis not present

## 2015-10-28 DIAGNOSIS — M25612 Stiffness of left shoulder, not elsewhere classified: Secondary | ICD-10-CM | POA: Diagnosis not present

## 2015-10-28 DIAGNOSIS — M25512 Pain in left shoulder: Secondary | ICD-10-CM | POA: Diagnosis not present

## 2015-11-02 DIAGNOSIS — M25512 Pain in left shoulder: Secondary | ICD-10-CM | POA: Diagnosis not present

## 2015-11-02 DIAGNOSIS — M25612 Stiffness of left shoulder, not elsewhere classified: Secondary | ICD-10-CM | POA: Diagnosis not present

## 2015-11-04 DIAGNOSIS — M25612 Stiffness of left shoulder, not elsewhere classified: Secondary | ICD-10-CM | POA: Diagnosis not present

## 2015-11-04 DIAGNOSIS — M25512 Pain in left shoulder: Secondary | ICD-10-CM | POA: Diagnosis not present

## 2015-11-09 DIAGNOSIS — M25512 Pain in left shoulder: Secondary | ICD-10-CM | POA: Diagnosis not present

## 2015-11-09 DIAGNOSIS — M25612 Stiffness of left shoulder, not elsewhere classified: Secondary | ICD-10-CM | POA: Diagnosis not present

## 2015-11-17 DIAGNOSIS — M25512 Pain in left shoulder: Secondary | ICD-10-CM | POA: Diagnosis not present

## 2015-11-17 DIAGNOSIS — M25612 Stiffness of left shoulder, not elsewhere classified: Secondary | ICD-10-CM | POA: Diagnosis not present

## 2015-11-18 DIAGNOSIS — M25612 Stiffness of left shoulder, not elsewhere classified: Secondary | ICD-10-CM | POA: Diagnosis not present

## 2015-11-18 DIAGNOSIS — M25512 Pain in left shoulder: Secondary | ICD-10-CM | POA: Diagnosis not present

## 2015-11-22 DIAGNOSIS — M25612 Stiffness of left shoulder, not elsewhere classified: Secondary | ICD-10-CM | POA: Diagnosis not present

## 2015-11-22 DIAGNOSIS — M25512 Pain in left shoulder: Secondary | ICD-10-CM | POA: Diagnosis not present

## 2015-11-24 DIAGNOSIS — M25612 Stiffness of left shoulder, not elsewhere classified: Secondary | ICD-10-CM | POA: Diagnosis not present

## 2015-11-24 DIAGNOSIS — M25512 Pain in left shoulder: Secondary | ICD-10-CM | POA: Diagnosis not present

## 2015-11-29 DIAGNOSIS — M25512 Pain in left shoulder: Secondary | ICD-10-CM | POA: Diagnosis not present

## 2015-11-29 DIAGNOSIS — M25612 Stiffness of left shoulder, not elsewhere classified: Secondary | ICD-10-CM | POA: Diagnosis not present

## 2015-12-01 DIAGNOSIS — M25512 Pain in left shoulder: Secondary | ICD-10-CM | POA: Diagnosis not present

## 2015-12-01 DIAGNOSIS — M25612 Stiffness of left shoulder, not elsewhere classified: Secondary | ICD-10-CM | POA: Diagnosis not present

## 2015-12-05 DIAGNOSIS — M25512 Pain in left shoulder: Secondary | ICD-10-CM | POA: Diagnosis not present

## 2015-12-05 DIAGNOSIS — M25612 Stiffness of left shoulder, not elsewhere classified: Secondary | ICD-10-CM | POA: Diagnosis not present

## 2015-12-08 DIAGNOSIS — M25512 Pain in left shoulder: Secondary | ICD-10-CM | POA: Diagnosis not present

## 2015-12-08 DIAGNOSIS — M25612 Stiffness of left shoulder, not elsewhere classified: Secondary | ICD-10-CM | POA: Diagnosis not present

## 2015-12-12 DIAGNOSIS — M25612 Stiffness of left shoulder, not elsewhere classified: Secondary | ICD-10-CM | POA: Diagnosis not present

## 2015-12-12 DIAGNOSIS — M25512 Pain in left shoulder: Secondary | ICD-10-CM | POA: Diagnosis not present

## 2015-12-15 DIAGNOSIS — M25512 Pain in left shoulder: Secondary | ICD-10-CM | POA: Diagnosis not present

## 2015-12-15 DIAGNOSIS — M25612 Stiffness of left shoulder, not elsewhere classified: Secondary | ICD-10-CM | POA: Diagnosis not present

## 2015-12-19 DIAGNOSIS — M25612 Stiffness of left shoulder, not elsewhere classified: Secondary | ICD-10-CM | POA: Diagnosis not present

## 2015-12-19 DIAGNOSIS — M25512 Pain in left shoulder: Secondary | ICD-10-CM | POA: Diagnosis not present

## 2015-12-22 DIAGNOSIS — M25612 Stiffness of left shoulder, not elsewhere classified: Secondary | ICD-10-CM | POA: Diagnosis not present

## 2015-12-22 DIAGNOSIS — M25512 Pain in left shoulder: Secondary | ICD-10-CM | POA: Diagnosis not present

## 2015-12-26 DIAGNOSIS — M25512 Pain in left shoulder: Secondary | ICD-10-CM | POA: Diagnosis not present

## 2015-12-26 DIAGNOSIS — M25612 Stiffness of left shoulder, not elsewhere classified: Secondary | ICD-10-CM | POA: Diagnosis not present

## 2015-12-29 DIAGNOSIS — M25512 Pain in left shoulder: Secondary | ICD-10-CM | POA: Diagnosis not present

## 2015-12-29 DIAGNOSIS — M25612 Stiffness of left shoulder, not elsewhere classified: Secondary | ICD-10-CM | POA: Diagnosis not present

## 2015-12-30 DIAGNOSIS — S42232A 3-part fracture of surgical neck of left humerus, initial encounter for closed fracture: Secondary | ICD-10-CM | POA: Diagnosis not present

## 2016-01-12 DIAGNOSIS — M25512 Pain in left shoulder: Secondary | ICD-10-CM | POA: Diagnosis not present

## 2016-01-12 DIAGNOSIS — M25612 Stiffness of left shoulder, not elsewhere classified: Secondary | ICD-10-CM | POA: Diagnosis not present

## 2016-01-23 DIAGNOSIS — M25612 Stiffness of left shoulder, not elsewhere classified: Secondary | ICD-10-CM | POA: Diagnosis not present

## 2016-01-23 DIAGNOSIS — M25512 Pain in left shoulder: Secondary | ICD-10-CM | POA: Diagnosis not present

## 2016-01-28 ENCOUNTER — Other Ambulatory Visit: Payer: Self-pay | Admitting: Family Medicine

## 2016-01-30 DIAGNOSIS — M25612 Stiffness of left shoulder, not elsewhere classified: Secondary | ICD-10-CM | POA: Diagnosis not present

## 2016-01-30 DIAGNOSIS — M25512 Pain in left shoulder: Secondary | ICD-10-CM | POA: Diagnosis not present

## 2016-02-10 DIAGNOSIS — M25612 Stiffness of left shoulder, not elsewhere classified: Secondary | ICD-10-CM | POA: Diagnosis not present

## 2016-02-10 DIAGNOSIS — M25512 Pain in left shoulder: Secondary | ICD-10-CM | POA: Diagnosis not present

## 2016-02-21 DIAGNOSIS — H43812 Vitreous degeneration, left eye: Secondary | ICD-10-CM | POA: Diagnosis not present

## 2016-03-21 ENCOUNTER — Encounter: Payer: Self-pay | Admitting: Family Medicine

## 2016-03-21 ENCOUNTER — Ambulatory Visit (INDEPENDENT_AMBULATORY_CARE_PROVIDER_SITE_OTHER): Payer: Medicare Other | Admitting: Family Medicine

## 2016-03-21 VITALS — BP 138/62 | HR 68 | Temp 98.0°F | Resp 16 | Ht 62.0 in | Wt 138.0 lb

## 2016-03-21 DIAGNOSIS — Z1231 Encounter for screening mammogram for malignant neoplasm of breast: Secondary | ICD-10-CM | POA: Diagnosis not present

## 2016-03-21 DIAGNOSIS — E7800 Pure hypercholesterolemia, unspecified: Secondary | ICD-10-CM | POA: Diagnosis not present

## 2016-03-21 DIAGNOSIS — I2510 Atherosclerotic heart disease of native coronary artery without angina pectoris: Secondary | ICD-10-CM

## 2016-03-21 DIAGNOSIS — K219 Gastro-esophageal reflux disease without esophagitis: Secondary | ICD-10-CM

## 2016-03-21 DIAGNOSIS — Z0000 Encounter for general adult medical examination without abnormal findings: Secondary | ICD-10-CM | POA: Diagnosis not present

## 2016-03-21 DIAGNOSIS — I10 Essential (primary) hypertension: Secondary | ICD-10-CM

## 2016-03-21 NOTE — Progress Notes (Signed)
Patient ID: Melanie Franklin, female   DOB: August 09, 1929, 81 y.o.   MRN: [id number]       Patient: Melanie Franklin, Female    DOB: [birth year], 82 y.o.   MRN: [id number] Visit Date: 03/21/2016  Today's Provider: Wilhemena Durie, MD   Chief Complaint  Patient presents with  . Medicare Annual Wellness   Subjective:    Annual wellness visit Melanie Franklin is a 82 y.o. female. She feels well. She reports exercising occasionally. She reports she is sleeping fairly well. Overall she feels pretty well and has no complaints. She is getting less steady with her age. Arthritis is becoming an issue. She has not had blood work in some time and needs this done.  Last:  Colonoscopy- 04/29/2006. Hemorrhoids. Normal.  Pap- 02/09/2004. Normal.  Mammogram- 11/02/2014. Normal.    BMD- 02/28/2009. Normal.      Review of Systems  Constitutional: Negative.   HENT: Negative.   Eyes: Negative.   Respiratory: Negative.   Cardiovascular: Negative.   Gastrointestinal: Negative.   Endocrine: Negative.   Genitourinary: Negative.   Musculoskeletal: Positive for arthralgias. Negative for myalgias, back pain, joint swelling, gait problem, neck pain and neck stiffness.  Skin: Negative.   Allergic/Immunologic: Negative.   Neurological: Negative.   Hematological: Negative.   Psychiatric/Behavioral: Negative.     Social History   Social History  . Marital Status: Married    Spouse Name: N/A  . Number of Children: N/A  . Years of Education: N/A   Occupational History  . Not on file.   Social History Main Topics  . Smoking status: Never Smoker   . Smokeless tobacco: Not on file  . Alcohol Use: No  . Drug Use: No  . Sexual Activity: Not on file   Other Topics Concern  . Not on file   Social History Narrative    No past medical history on file.   Patient Active Problem List   Diagnosis Date Noted  . Memory loss or impairment 07/11/2015  . Arthritis 05/18/2015  . Arthropathy of hand 05/18/2015    . CAD in native artery 05/18/2015  . Carpal tunnel syndrome 05/18/2015  . DDD (degenerative disc disease), cervical 05/18/2015  . Clinical depression 05/18/2015  . Gastro-esophageal reflux disease without esophagitis 05/18/2015  . Essential (primary) hypertension 05/18/2015  . Herpes zona 05/18/2015  . Adaptive colitis 05/18/2015  . Hypercholesteremia 05/18/2015  . 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 Fluttering heart ([place]) 05/18/2015  . Restless leg 05/18/2015  . Apnea, sleep 05/18/2015  . Umbilical hernia without obstruction and without gangrene 05/18/2015  . TI (tricuspid incompetence) 05/04/2015  . Obstructive apnea 09/30/2014  . MI (mitral incompetence) 09/30/2014  . Artificial cardiac pacemaker 01/27/2014  . Acquired complete AV block ([place]) 01/27/2014    Past Surgical History  Procedure Laterality Date  . Lasik    . Pacemaker insertion    . Coronary artery bypass graft  03/09/2009    x 4 [facility]  . Esophageal dilation    . Knee arthroscopy Left   . Cataract extraction Bilateral   . Tonsillectomy      Her family history includes CAD in her sister; Cancer in her sister; Cerebrovascular Accident in her mother; Colon cancer in her mother; Congestive Heart Failure in her sister; Diabetes in her sister; Hypertension in her brother, mother, and sister; Kidney Stones in her sister; Other in her mother and sister; Stroke in her father; Ulcers in her mother.  Previous Medications   ACETAMINOPHEN (TYLENOL) 650 MG CR TABLET    Take by mouth.   AMLODIPINE (NORVASC) 5 MG TABLET    Take by mouth.   ASPIRIN 81 MG TABLET    Take by mouth.   CALCIUM-MAGNESIUM-VITAMIN D (CALCIUM 500 PO)    Take by mouth.   MAGNESIUM OXIDE (MAG-OX) 400 MG TABLET    Take by mouth.   MISC NATURAL PRODUCTS (GLUCOSAMINE CHONDROITIN COMPLX) TABS    Take by mouth.   MULTIPLE VITAMINS PO    Take by mouth.   OMEPRAZOLE (PRILOSEC) 20 MG CAPSULE    Take 1 capsule (20 mg total) by  mouth daily.   PRAVASTATIN (PRAVACHOL) 40 MG TABLET    TAKE 1 TABLET BY MOUTH DAILY   QUINAPRIL-HYDROCHLOROTHIAZIDE (ACCURETIC) 20-12.5 MG PER TABLET    Take by mouth.   TRAMADOL (ULTRAM) 50 MG TABLET    Take 1 tablet (50 mg total) by mouth every 6 (six) hours as needed.    Patient Care Team: Jerrol Banana., MD as PCP - General (Family Medicine)     Objective:   Vitals: BP 138/62 mmHg  Pulse 68  Temp(Src) 98 F (36.7 C)  Resp 16  Ht 5\' 2"  (1.575 m)  Wt 138 lb (62.596 kg)  BMI 25.23 kg/m2  Physical Exam  Constitutional: She is oriented to person, place, and time. She appears well-developed and well-nourished.  HENT:  Head: Normocephalic and atraumatic.  Right Ear: External ear normal.  Left Ear: External ear normal.  Nose: Nose normal.  Mouth/Throat: Oropharynx is clear and moist.  Eyes: Conjunctivae are normal.  Neck: Normal range of motion. Neck supple.  proximal clavicle swelling on right side.   Cardiovascular: Normal rate, regular rhythm and normal heart sounds.   Pulmonary/Chest: Effort normal and breath sounds normal.  Inverted nipple on right breast.   Abdominal: Soft. Bowel sounds are normal.  Moderate umblicial hernia. Easily reducible.  Neurological: She is alert and oriented to person, place, and time.  Skin: Skin is warm and dry.  Psychiatric: She has a normal mood and affect. Her behavior is normal. Judgment and thought content normal.    Activities of Daily Living In your present state of health, do you have any difficulty performing the following activities: 03/21/2016  Hearing? N  Vision? N  Difficulty concentrating or making decisions? N  Walking or climbing stairs? N  Dressing or bathing? Y  Doing errands, shopping? N    Fall Risk Assessment Fall Risk  03/21/2016  Falls in the past year? Yes  Number falls in past yr: 1  Injury with Fall? Yes     Depression Screen PHQ 2/9 Scores 03/21/2016  PHQ - 2 Score 0    Cognitive Testing -  6-CIT  Correct? Score   What year is it? yes 0 0 or 4  What month is it? yes 0 0 or 3  Memorize:    [name],  44,  [place],      What time is it? (within 1 hour) yes 0 0 or 3  Count backwards from 20 yes 0 0, 2, or 4  Name the months of the year yes 0 0, 2, or 4  Repeat name & address above no 4 0, 2, 4, 6, 8, or 10       TOTAL SCORE  4/28   Interpretation:  Normal  Normal (0-7) Abnormal (8-28)       Assessment & Plan:  Annual Wellness Visit  Reviewed patient's Family Medical History Reviewed and updated list of patient's medical providers Assessment of cognitive impairment was done Assessed patient's functional ability Established a written schedule for health screening [place] Completed and Reviewed  Exercise Activities and Dietary recommendations Goals    None      Immunization History  Administered Date(s) Administered  . Influenza, High Dose Seasonal PF 08/01/2015  . Pneumococcal Conjugate-13 10/04/2014  . Pneumococcal Polysaccharide-23 09/22/2005  . Td 02/09/2004  . Zoster 08/09/2008    Health Maintenance  Topic Date Due  . Samul Dada  02/08/2014  . INFLUENZA VACCINE  06/19/2016  . DEXA SCAN  Completed  . ZOSTAVAX  Completed  . PNA vac Low Risk Adult  Completed      Discussed health benefits of physical activity, and encouraged her to engage in regular exercise appropriate for her age and condition.  CAD All risk factors treated Hypertension Hyperlipidemia Osteoarthritis Degenerative disc disease Pacemaker in place  I have done the exam and reviewed the above chart and it is accurate to the best of my knowledge.  ------------------------------------------------------------------------------------------------------------

## 2016-03-22 LAB — CBC WITH DIFFERENTIAL/PLATELET
Basophils Absolute: 0 10*3/uL (ref 0.0–0.2)
Basos: 0 %
EOS (ABSOLUTE): 0.1 10*3/uL (ref 0.0–0.4)
EOS: 1 %
HEMATOCRIT: 38.2 % (ref 34.0–46.6)
HEMOGLOBIN: 13.2 g/dL (ref 11.1–15.9)
Immature Grans (Abs): 0 10*3/uL (ref 0.0–0.1)
Immature Granulocytes: 0 %
LYMPHS ABS: 2 10*3/uL (ref 0.7–3.1)
Lymphs: 35 %
MCH: 33 pg (ref 26.6–33.0)
MCHC: 34.6 g/dL (ref 31.5–35.7)
MCV: 96 fL (ref 79–97)
MONOCYTES: 15 %
MONOS ABS: 0.8 10*3/uL (ref 0.1–0.9)
NEUTROS ABS: 2.8 10*3/uL (ref 1.4–7.0)
Neutrophils: 49 %
Platelets: 291 10*3/uL (ref 150–379)
RBC: 4 x10E6/uL (ref 3.77–5.28)
RDW: 12.7 % (ref 12.3–15.4)
WBC: 5.7 10*3/uL (ref 3.4–10.8)

## 2016-03-22 LAB — COMPREHENSIVE METABOLIC PANEL
A/G RATIO: 2 (ref 1.2–2.2)
ALBUMIN: 4.5 g/dL (ref 3.5–4.7)
ALK PHOS: 59 IU/L (ref 39–117)
ALT: 13 IU/L (ref 0–32)
AST: 17 IU/L (ref 0–40)
BILIRUBIN TOTAL: 0.5 mg/dL (ref 0.0–1.2)
BUN / CREAT RATIO: 24 (ref 12–28)
BUN: 13 mg/dL (ref 8–27)
CHLORIDE: 98 mmol/L (ref 96–106)
CO2: 24 mmol/L (ref 18–29)
CREATININE: 0.55 mg/dL — AB (ref 0.57–1.00)
Calcium: 9.8 mg/dL (ref 8.7–10.3)
GFR calc Af Amer: 100 mL/min/{1.73_m2} (ref >59)
GFR calc non Af Amer: 86 mL/min/{1.73_m2} (ref >59)
GLOBULIN, TOTAL: 2.3 g/dL (ref 1.5–4.5)
Glucose: 105 mg/dL — ABNORMAL HIGH (ref 65–99)
POTASSIUM: 4.1 mmol/L (ref 3.5–5.2)
SODIUM: 139 mmol/L (ref 134–144)
Total Protein: 6.8 g/dL (ref 6.0–8.5)

## 2016-03-22 LAB — LIPID PANEL
CHOL/HDL RATIO: 2.7 ratio (ref 0.0–4.4)
CHOLESTEROL TOTAL: 200 mg/dL — AB (ref 100–199)
HDL: 75 mg/dL (ref >39)
LDL CALC: 101 mg/dL — AB (ref 0–99)
Triglycerides: 120 mg/dL (ref 0–149)
VLDL Cholesterol Cal: 24 mg/dL (ref 5–40)

## 2016-03-22 LAB — TSH: TSH: 1.06 u[IU]/mL (ref 0.450–4.500)

## 2016-03-22 MED ORDER — OMEPRAZOLE 20 MG PO CPDR: 20.0000 mg | DELAYED_RELEASE_CAPSULE | Freq: Every day | ORAL | Status: DC

## 2016-03-30 ENCOUNTER — Other Ambulatory Visit: Payer: Self-pay | Admitting: Family Medicine

## 2016-03-30 ENCOUNTER — Ambulatory Visit
Admission: RE | Admit: 2016-03-30 | Discharge: 2016-03-30 | Disposition: A | Discharge status: Home / Self Care | Payer: Medicare Other | Source: Ambulatory Visit | Attending: Family Medicine | Admitting: Family Medicine

## 2016-03-30 DIAGNOSIS — Z1231 Encounter for screening mammogram for malignant neoplasm of breast: Secondary | ICD-10-CM | POA: Diagnosis not present

## 2016-04-20 ENCOUNTER — Other Ambulatory Visit: Payer: Self-pay

## 2016-04-20 MED ORDER — AMLODIPINE BESYLATE 5 MG PO TABS: 5.0000 mg | ORAL_TABLET | Freq: Every day | ORAL | Status: DC

## 2016-04-24 ENCOUNTER — Other Ambulatory Visit: Payer: Self-pay | Admitting: Family Medicine

## 2016-04-26 ENCOUNTER — Other Ambulatory Visit: Payer: Self-pay | Admitting: Family Medicine

## 2016-04-26 MED ORDER — QUINAPRIL-HYDROCHLOROTHIAZIDE 20-12.5 MG PO TABS: 1.0000 | ORAL_TABLET | Freq: Every day | ORAL | Status: DC

## 2016-04-26 NOTE — Telephone Encounter (Signed)
Med called into pharmacy. Pt informed.  

## 2016-04-26 NOTE — Telephone Encounter (Signed)
Pt contacted office for refill request on the following medications:  quinapril-hydrochlorothiazide (ACCURETIC) 20-12.5 MG per tablet.  Melanie Franklin.  [id number]

## 2016-04-30 ENCOUNTER — Other Ambulatory Visit: Payer: Self-pay

## 2016-04-30 MED ORDER — QUINAPRIL-HYDROCHLOROTHIAZIDE 20-12.5 MG PO TABS: 2.0000 | ORAL_TABLET | Freq: Every day | ORAL | Status: DC

## 2016-04-30 MED ORDER — QUINAPRIL-HYDROCHLOROTHIAZIDE 20-12.5 MG PO TABS: 1.0000 | ORAL_TABLET | Freq: Every day | ORAL | Status: DC

## 2016-05-31 DIAGNOSIS — I10 Essential (primary) hypertension: Secondary | ICD-10-CM | POA: Diagnosis not present

## 2016-05-31 DIAGNOSIS — I442 Atrioventricular block, complete: Secondary | ICD-10-CM | POA: Diagnosis not present

## 2016-05-31 DIAGNOSIS — I208 Other forms of angina pectoris: Secondary | ICD-10-CM | POA: Diagnosis not present

## 2016-05-31 DIAGNOSIS — I25708 Atherosclerosis of coronary artery bypass graft(s), unspecified, with other forms of angina pectoris: Secondary | ICD-10-CM | POA: Diagnosis not present

## 2016-06-06 DIAGNOSIS — R079 Chest pain, unspecified: Secondary | ICD-10-CM | POA: Insufficient documentation

## 2016-06-18 DIAGNOSIS — Z950 Presence of cardiac pacemaker: Secondary | ICD-10-CM | POA: Diagnosis not present

## 2016-06-18 DIAGNOSIS — I209 Angina pectoris, unspecified: Secondary | ICD-10-CM | POA: Diagnosis not present

## 2016-06-18 DIAGNOSIS — G4733 Obstructive sleep apnea (adult) (pediatric): Secondary | ICD-10-CM | POA: Diagnosis not present

## 2016-06-18 DIAGNOSIS — I25708 Atherosclerosis of coronary artery bypass graft(s), unspecified, with other forms of angina pectoris: Secondary | ICD-10-CM | POA: Diagnosis not present

## 2016-08-02 ENCOUNTER — Other Ambulatory Visit: Payer: Self-pay | Admitting: Family Medicine

## 2016-08-02 DIAGNOSIS — K219 Gastro-esophageal reflux disease without esophagitis: Secondary | ICD-10-CM

## 2016-08-14 DIAGNOSIS — Z23 Encounter for immunization: Secondary | ICD-10-CM | POA: Diagnosis not present

## 2016-09-04 DIAGNOSIS — L720 Epidermal cyst: Secondary | ICD-10-CM | POA: Diagnosis not present

## 2016-09-04 DIAGNOSIS — D2271 Melanocytic nevi of right lower limb, including hip: Secondary | ICD-10-CM | POA: Diagnosis not present

## 2016-09-04 DIAGNOSIS — D225 Melanocytic nevi of trunk: Secondary | ICD-10-CM | POA: Diagnosis not present

## 2016-09-04 DIAGNOSIS — D2262 Melanocytic nevi of left upper limb, including shoulder: Secondary | ICD-10-CM | POA: Diagnosis not present

## 2016-10-01 ENCOUNTER — Encounter: Payer: Self-pay | Admitting: Family Medicine

## 2016-10-01 ENCOUNTER — Ambulatory Visit (INDEPENDENT_AMBULATORY_CARE_PROVIDER_SITE_OTHER): Payer: Medicare Other | Admitting: Family Medicine

## 2016-10-01 VITALS — BP 120/66 | HR 68 | Temp 98.3°F | Resp 16 | Ht 62.0 in | Wt 140.0 lb

## 2016-10-01 DIAGNOSIS — I25119 Atherosclerotic heart disease of native coronary artery with unspecified angina pectoris: Secondary | ICD-10-CM | POA: Diagnosis not present

## 2016-10-01 DIAGNOSIS — E7800 Pure hypercholesterolemia, unspecified: Secondary | ICD-10-CM

## 2016-10-01 DIAGNOSIS — G2581 Restless legs syndrome: Secondary | ICD-10-CM | POA: Diagnosis not present

## 2016-10-01 DIAGNOSIS — G629 Polyneuropathy, unspecified: Secondary | ICD-10-CM

## 2016-10-01 DIAGNOSIS — I2510 Atherosclerotic heart disease of native coronary artery without angina pectoris: Secondary | ICD-10-CM

## 2016-10-01 DIAGNOSIS — I10 Essential (primary) hypertension: Secondary | ICD-10-CM

## 2016-10-01 DIAGNOSIS — I209 Angina pectoris, unspecified: Secondary | ICD-10-CM

## 2016-10-01 DIAGNOSIS — R351 Nocturia: Secondary | ICD-10-CM

## 2016-10-01 DIAGNOSIS — M1990 Unspecified osteoarthritis, unspecified site: Secondary | ICD-10-CM | POA: Diagnosis not present

## 2016-10-01 MED ORDER — ROPINIROLE HCL 1 MG PO TABS: 1.0000 mg | ORAL_TABLET | Freq: Every day | ORAL | 5 refills | Status: DC

## 2016-10-01 NOTE — Progress Notes (Signed)
Patient: Melanie Franklin Female    DOB: 12-19-1928   82 y.o.   MRN: [id number] Visit Date: 10/01/2016  Today's Provider: Wilhemena Durie, MD   Chief Complaint  Patient presents with  . Hypertension  . Hyperlipidemia  . Arthritis   Subjective:    HPI  Hypertension, follow-up:  BP Readings from Last 3 Encounters:  10/01/16 120/66  03/21/16 138/62  09/21/15 (!) 119/50    She was last seen for hypertension 6 months ago.  BP at that visit was 138/62. Management changes since that visit include Quinapril-HCTZ 20-12.5 mg 1 tablet daily. She reports excellent compliance with treatment. She is not having side effects. She is not exercising. She is adherent to low salt diet.   Outside blood pressures are stable. She is experiencing none.  Patient denies chest pain.   Cardiovascular risk factors include advanced age (older than 33 for men, 73 for women) and hypertension.  Use of agents associated with hypertension: none.     Weight trend: stable Wt Readings from Last 3 Encounters:  10/01/16 140 lb (63.5 kg)  03/21/16 138 lb (62.6 kg)  09/21/15 138 lb (62.6 kg)    Current diet: in general, a "healthy" diet    ------------------------------------------------------------------------   Lipid/Cholesterol, Follow-up:   Last seen for this6 months ago.  Management changes since that visit include no changes. . Last Lipid Panel:    Component Value Date/Time   CHOL 200 (H) 03/21/2016 1054   TRIG 120 03/21/2016 1054   HDL 75 03/21/2016 1054   CHOLHDL 2.7 03/21/2016 1054   LDLCALC 101 (H) 03/21/2016 1054   Risk factors for vascular disease include hypertension  She reports excellent compliance with treatment. She is not having side effects.  Current symptoms include none and have been stable. Current exercise: housecleaning   -------------------------------------------------------------------   Follow up for Arthritis  The patient was last seen for this  6 months ago. Changes made at last visit include no changes.  She reports excellent compliance with treatment. She feels that condition is Unchanged. She is not having side effects. Patient reports taking Tylenol 650 mg BID would like to increase to TID due to increased pain from weather changes.  ------------------------------------------------------------------------------------ Patient does have a couple of new issues. She has had no urinary symptoms but has had nocturia 3-4 for the past year or so. The other issue is restless leg syndrome affects her sleep at night. She finds her legs and feet moving about during the night.    Allergies  Allergen Reactions  . Ciprofloxacin   . Meloxicam   . Amoxicillin Swelling and Rash  . Entex Lq  [Phenylephrine-Guaifenesin] Swelling and Rash     Current Outpatient Prescriptions:  .  acetaminophen (TYLENOL) 650 MG CR tablet, Take 650 mg by mouth 2 (two) times daily. , Disp: , Rfl:  .  amLODipine (NORVASC) 5 MG tablet, Take 1 tablet (5 mg total) by mouth daily., Disp: 90 tablet, Rfl: 3 .  aspirin 81 MG tablet, Take by mouth., Disp: , Rfl:  .  magnesium oxide (MAG-OX) 400 MG tablet, Take by mouth., Disp: , Rfl:  .  MULTIPLE VITAMINS PO, Take by mouth., Disp: , Rfl:  .  omeprazole (PRILOSEC) 20 MG capsule, TAKE 1 CAPSULE BY MOUTH DAILY, Disp: 30 capsule, Rfl: 5 .  pravastatin (PRAVACHOL) 40 MG tablet, TAKE 1 TABLET DAILY, Disp: 90 tablet, Rfl: 3 .  quinapril-hydrochlorothiazide (ACCURETIC) 20-12.5 MG tablet, Take 2 tablets by mouth  daily. (Patient taking differently: Take 1 tablet by mouth daily. ), Disp: 180 tablet, Rfl: 3  Review of Systems  Constitutional: Negative.   HENT: Negative.   Respiratory: Negative.   Cardiovascular: Negative.   Genitourinary:       Past year or so patient has had nocturia 3-4.  No Dysuria or hematuria.  Musculoskeletal: Positive for arthralgias.  Neurological: Negative.   Psychiatric/Behavioral: Negative.      Social History  Substance Use Topics  . Smoking status: Never Smoker  . Smokeless tobacco: Never Used  . Alcohol use No   Objective:   BP 120/66 (BP Location: Left Arm, Patient Position: Sitting, Cuff Size: Large)   Pulse 68   Temp 98.3 F (36.8 C) (Oral)   Resp 16   Ht 5\' 2"  (1.575 m)   Wt 140 lb (63.5 kg)   SpO2 97%   BMI 25.61 kg/m   Physical Exam  Constitutional: She is oriented to person, place, and time. She appears well-developed and well-nourished.  HENT:  Head: Normocephalic and atraumatic.  Right Ear: External ear normal.  Left Ear: External ear normal.  Nose: Nose normal.  Mouth/Throat: Oropharynx is clear and moist.  Eyes: Conjunctivae are normal. No scleral icterus.  Neck: Neck supple. No thyromegaly present.  Cardiovascular: Normal rate, regular rhythm and normal heart sounds.   Pulmonary/Chest: Effort normal and breath sounds normal.  Abdominal: Soft.  Musculoskeletal: She exhibits no edema.  Neurological: She is alert and oriented to person, place, and time. No cranial nerve deficit. She exhibits normal muscle tone. Coordination normal.  Skin: Skin is warm and dry.  Psychiatric: She has a normal mood and affect. Her behavior is normal. Judgment and thought content normal.        Assessment & Plan:     1. Essential (primary) hypertension   2. Hypercholesteremia   3. Arthritis   4. Neuropathy ([place])   5. Nocturia Patient declines referral for this.  - Urine culture  6. Restless legs syndrome There are multiple medications that could help the patient. We'll make sure she is not iron deficient first. - rOPINIRole (REQUIP) 1 MG tablet; Take 1 tablet (1 mg total) by mouth at bedtime.  Dispense: 30 tablet; Refill: 5  7. Coronary artery disease with angina pectoris, unspecified vessel or lesion type, unspecified whether native or transplanted heart ([place]) 8.Pacemaker in place for complete heart block      I have done the exam and reviewed  the above chart and it is accurate to the best of my knowledge. Development worker, community has been used in this note in any air is in the dictation or transcription are unintentional.  Wilhemena Durie, MD  [place]

## 2016-10-04 DIAGNOSIS — R351 Nocturia: Secondary | ICD-10-CM | POA: Diagnosis not present

## 2016-10-05 LAB — URINE CULTURE

## 2016-10-08 ENCOUNTER — Telehealth: Payer: Self-pay

## 2016-10-08 NOTE — Telephone Encounter (Signed)
-----   Message from Jerrol Banana., MD sent at 10/06/2016 10:33 AM EST ----- No UTI

## 2016-10-08 NOTE — Telephone Encounter (Signed)
[id number] 

## 2016-10-10 NOTE — Telephone Encounter (Signed)
[id number] 

## 2016-10-15 ENCOUNTER — Telehealth: Payer: Self-pay | Admitting: Family Medicine

## 2016-10-15 NOTE — Telephone Encounter (Signed)
Patient was advised. KW 

## 2016-10-15 NOTE — Telephone Encounter (Signed)
Pt would like to get her lab results form her visit with Dr. Rosanna Randy an couple weeks ago.  She has been out of town.  Her call back is [phone]  [facility] [place]

## 2016-12-04 DIAGNOSIS — I25810 Atherosclerosis of coronary artery bypass graft(s) without angina pectoris: Secondary | ICD-10-CM | POA: Diagnosis not present

## 2016-12-04 DIAGNOSIS — I442 Atrioventricular block, complete: Secondary | ICD-10-CM | POA: Diagnosis not present

## 2016-12-04 DIAGNOSIS — I10 Essential (primary) hypertension: Secondary | ICD-10-CM | POA: Diagnosis not present

## 2016-12-04 DIAGNOSIS — I340 Nonrheumatic mitral (valve) insufficiency: Secondary | ICD-10-CM | POA: Diagnosis not present

## 2016-12-10 ENCOUNTER — Ambulatory Visit (INDEPENDENT_AMBULATORY_CARE_PROVIDER_SITE_OTHER): Payer: Medicare Other | Admitting: Family Medicine

## 2016-12-10 VITALS — BP 168/60 | HR 80 | Temp 97.9°F | Resp 16 | Wt 141.0 lb

## 2016-12-10 DIAGNOSIS — G2581 Restless legs syndrome: Secondary | ICD-10-CM

## 2016-12-10 DIAGNOSIS — I10 Essential (primary) hypertension: Secondary | ICD-10-CM

## 2016-12-10 DIAGNOSIS — I2510 Atherosclerotic heart disease of native coronary artery without angina pectoris: Secondary | ICD-10-CM | POA: Diagnosis not present

## 2016-12-10 DIAGNOSIS — M1990 Unspecified osteoarthritis, unspecified site: Secondary | ICD-10-CM | POA: Diagnosis not present

## 2016-12-10 NOTE — Progress Notes (Signed)
Melanie Franklin  MRN: [id number] DOB: 12-26-1928  Subjective:  HPI   The patient is an 83 year old female who presents for follow up of her restless leg syndrome.  She was last seen on 10/01/2016 and was started on Ropinirole 1 mg at bedtime.   The patient states that she noticed on her bottle of medicine that there was a warning that it may cause dizziness.  She has not started the medicne as she wanted to talk to the doctor about the dizziness first.   The patient had many questions about her medications and there are still some questions that will need to be addressed once we check in the old system.   The patient had many questions about her advanced directives and healthcare POA that were discussed with her while in the office today.  Patient Active Problem List   Diagnosis Date Noted  . Chest pain 06/06/2016  . Memory loss or impairment 07/11/2015  . Arthritis 05/18/2015  . Arthropathy of hand 05/18/2015  . CAD in native artery 05/18/2015  . Carpal tunnel syndrome 05/18/2015  . DDD (degenerative disc disease), cervical 05/18/2015  . Clinical depression 05/18/2015  . Gastro-esophageal reflux disease without esophagitis 05/18/2015  . Essential (primary) hypertension 05/18/2015  . Herpes zona 05/18/2015  . Adaptive colitis 05/18/2015  . Hypercholesteremia 05/18/2015  . 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 Fluttering heart 05/18/2015  . Restless leg 05/18/2015  . Apnea, sleep 05/18/2015  . Umbilical hernia without obstruction and without gangrene 05/18/2015  . TI (tricuspid incompetence) 05/04/2015  . Obstructive apnea 09/30/2014  . MI (mitral incompetence) 09/30/2014  . Artificial cardiac pacemaker 01/27/2014  . Acquired complete AV block ([place]) 01/27/2014    No past medical history on file.  Social History   Social History  . Marital status: Married    Spouse name: N/A  . Number of children: N/A  . Years of education: N/A   Occupational History  . Not on file.    Social History Main Topics  . Smoking status: Never Smoker  . Smokeless tobacco: Never Used  . Alcohol use No  . Drug use: No  . Sexual activity: Not on file   Other Topics Concern  . Not on file   Social History Narrative  . No narrative on file    Outpatient Encounter Prescriptions as of 12/10/2016  Medication Sig Note  . acetaminophen (TYLENOL) 650 MG CR tablet Take 650 mg by mouth 2 (two) times daily.  05/18/2015: Medication taken as needed.  Received from: [facility]  . amLODipine (NORVASC) 5 MG tablet Take 1 tablet (5 mg total) by mouth daily.   Marland Kitchen aspirin 81 MG tablet Take by mouth. 05/18/2015: Received from: [facility]  . magnesium oxide (MAG-OX) 400 MG tablet Take by mouth. 05/18/2015: Received from: [facility]  . MULTIPLE VITAMINS PO Take by mouth. 05/18/2015: Received from: [facility]  . omeprazole (PRILOSEC) 20 MG capsule TAKE 1 CAPSULE BY MOUTH DAILY   . pravastatin (PRAVACHOL) 40 MG tablet TAKE 1 TABLET DAILY   . quinapril-hydrochlorothiazide (ACCURETIC) 20-12.5 MG tablet Take 2 tablets by mouth daily. (Patient taking differently: Take 1 tablet by mouth daily. )   . rOPINIRole (REQUIP) 1 MG tablet Take 1 tablet (1 mg total) by mouth at bedtime. (Patient not taking: Reported on 12/10/2016)    No facility-administered encounter medications on file as of 12/10/2016.     Allergies  Allergen Reactions  . Ciprofloxacin   .  Meloxicam   . Amoxicillin Swelling and Rash  . Entex Lq  [Phenylephrine-Guaifenesin] Swelling and Rash    Review of Systems  Constitutional: Negative for fever and malaise/fatigue.  Eyes: Negative.   Respiratory: Negative for cough, shortness of breath and wheezing.   Cardiovascular: Negative for chest pain, palpitations, orthopnea and leg swelling.  Gastrointestinal: Negative.   Musculoskeletal: Positive for joint pain. Negative for back pain, falls, myalgias and neck pain.    Skin: Negative.   Neurological: Negative.  Negative for weakness.  Endo/Heme/Allergies: Negative.   Psychiatric/Behavioral: Negative.     Objective:  BP (!) 168/60 (BP Location: Right Arm, Patient Position: Sitting, Cuff Size: Normal)   Pulse 80   Temp 97.9 F (36.6 C) (Oral)   Resp 16   Wt 141 lb (64 kg)   BMI 25.79 kg/m   Physical Exam  Constitutional: She is oriented to person, place, and time and well-developed, well-nourished, and in no distress.  HENT:  Head: Normocephalic and atraumatic.  Right Ear: External ear normal.  Left Ear: External ear normal.  Nose: Nose normal.  Eyes: Conjunctivae are normal. Pupils are equal, round, and reactive to light. No scleral icterus.  Neck: Normal range of motion. No thyromegaly present.  Cardiovascular: Normal rate, regular rhythm and normal heart sounds.   Pulmonary/Chest: Effort normal and breath sounds normal.  Abdominal: Soft.  Neurological: She is alert and oriented to person, place, and time.  Skin: Skin is warm and dry.  Psychiatric: Mood, memory, affect and judgment normal.    Assessment and Plan :  Restless leg syndrome Past patient to try the ropinirole which might help her. She can stop at any point in time.  RLS is definitely impacting her sleep. We did discussed at length her medications. CAD Pacemaker in place for complete heart block Hypertension Hyperlipidemia Osteoarthritis       HPI, Exam and A&P Transcribed under the direction and in the presence of Wilhemena Durie., MD. Electronically Signed: Althea Charon, RMA I have done the exam and reviewed the chart and it is accurate to the best of my knowledge. Development worker, community has been used and  any errors in dictation or transcription are unintentional. Miguel Aschoff M.D. [facility]

## 2017-02-25 DIAGNOSIS — Z961 Presence of intraocular lens: Secondary | ICD-10-CM | POA: Diagnosis not present

## 2017-03-01 ENCOUNTER — Other Ambulatory Visit: Payer: Self-pay | Admitting: Family Medicine

## 2017-03-01 DIAGNOSIS — Z1231 Encounter for screening mammogram for malignant neoplasm of breast: Secondary | ICD-10-CM

## 2017-03-18 ENCOUNTER — Ambulatory Visit: Payer: Medicare Other | Admitting: Family Medicine

## 2017-03-21 ENCOUNTER — Ambulatory Visit (INDEPENDENT_AMBULATORY_CARE_PROVIDER_SITE_OTHER): Payer: Medicare Other

## 2017-03-21 ENCOUNTER — Encounter: Payer: Self-pay | Admitting: Family Medicine

## 2017-03-21 ENCOUNTER — Ambulatory Visit (INDEPENDENT_AMBULATORY_CARE_PROVIDER_SITE_OTHER): Payer: Medicare Other | Admitting: Family Medicine

## 2017-03-21 ENCOUNTER — Ambulatory Visit
Admission: RE | Admit: 2017-03-21 | Discharge: 2017-03-21 | Disposition: A | Discharge status: Home / Self Care | Payer: Medicare Other | Source: Ambulatory Visit | Attending: Family Medicine | Admitting: Family Medicine

## 2017-03-21 VITALS — BP 162/74 | HR 80 | Temp 98.9°F | Resp 16 | Ht 62.0 in | Wt 140.0 lb

## 2017-03-21 VITALS — BP 172/60 | HR 80 | Temp 98.9°F | Ht 62.0 in | Wt 140.8 lb

## 2017-03-21 DIAGNOSIS — R531 Weakness: Secondary | ICD-10-CM | POA: Diagnosis not present

## 2017-03-21 DIAGNOSIS — R319 Hematuria, unspecified: Secondary | ICD-10-CM

## 2017-03-21 DIAGNOSIS — M545 Low back pain, unspecified: Secondary | ICD-10-CM

## 2017-03-21 DIAGNOSIS — I10 Essential (primary) hypertension: Secondary | ICD-10-CM

## 2017-03-21 DIAGNOSIS — Z0000 Encounter for general adult medical examination without abnormal findings: Secondary | ICD-10-CM | POA: Diagnosis not present

## 2017-03-21 DIAGNOSIS — R109 Unspecified abdominal pain: Secondary | ICD-10-CM | POA: Insufficient documentation

## 2017-03-21 DIAGNOSIS — G2581 Restless legs syndrome: Secondary | ICD-10-CM | POA: Diagnosis not present

## 2017-03-21 DIAGNOSIS — M1990 Unspecified osteoarthritis, unspecified site: Secondary | ICD-10-CM | POA: Diagnosis not present

## 2017-03-21 DIAGNOSIS — I2510 Atherosclerotic heart disease of native coronary artery without angina pectoris: Secondary | ICD-10-CM | POA: Diagnosis not present

## 2017-03-21 DIAGNOSIS — R079 Chest pain, unspecified: Secondary | ICD-10-CM

## 2017-03-21 DIAGNOSIS — E7800 Pure hypercholesterolemia, unspecified: Secondary | ICD-10-CM

## 2017-03-21 LAB — POCT URINALYSIS DIPSTICK
Bilirubin, UA: NEGATIVE
Glucose, UA: NEGATIVE
Ketones, UA: NEGATIVE
LEUKOCYTES UA: NEGATIVE
NITRITE UA: NEGATIVE
PH UA: 6 (ref 5.0–8.0)
Spec Grav, UA: 1.015 (ref 1.010–1.025)
UROBILINOGEN UA: 0.2 U/dL

## 2017-03-21 NOTE — Patient Instructions (Addendum)
Go downstairs for labs then across the street for xray. We have sent your urine off for culture. We will call you with the results. Take two Quinapril- hydrochlorothiazide daily as directed on the recent rx bottle. Someone will call you with your appointment with Dr. Nehemiah Massed.

## 2017-03-21 NOTE — Patient Instructions (Addendum)
Melanie Franklin , Thank you for taking time to come for your Medicare Wellness Visit. I appreciate your ongoing commitment to your health goals. Please review the following plan we discussed and let me know if I can assist you in the future.   Screening recommendations/referrals: Colonoscopy: completed 04/29/06 Mammogram: completed 03/30/16 Bone Density: completed 11/21/06 Recommended yearly ophthalmology/optometry visit for glaucoma screening and checkup Recommended yearly dental visit for hygiene and checkup  Vaccinations: Influenza vaccine: up to date, due 07/2017 Pneumococcal vaccine: completed series Tdap vaccine: completed 02/09/04, declined today Shingles vaccine: completed 08/09/08   Advanced directives: Advance directive discussed with you today. I have provided a copy for you to complete at home and have notarized. Once this is complete please bring a copy in to our office so we can scan it into your chart.   Conditions/risks identified: Recommending increasing water intake to 4-5 glasses a day.  Next appointment: None, need to schedule 1 year AWV.   Preventive Care 40 Years and Older, Female Preventive care refers to lifestyle choices and visits with your health care provider that can promote health and wellness. What does preventive care include?  A yearly physical exam. This is also called an annual well check.  Dental exams once or twice a year.  Routine eye exams. Ask your health care provider how often you should have your eyes checked.  Personal lifestyle choices, including:  Daily care of your teeth and gums.  Regular physical activity.  Eating a healthy diet.  Avoiding tobacco and drug use.  Limiting alcohol use.  Practicing safe sex.  Taking low-dose aspirin every day.  Taking vitamin and mineral supplements as recommended by your health care provider. What happens during an annual well check? The services and screenings done by your health care  provider during your annual well check will depend on your age, overall health, lifestyle risk factors, and family history of disease. Counseling  Your health care provider may ask you questions about your:  Alcohol use.  Tobacco use.  Drug use.  Emotional well-being.  Home and relationship well-being.  Sexual activity.  Eating habits.  History of falls.  Memory and ability to understand (cognition).  Work and work Statistician.  Reproductive health. Screening  You may have the following tests or measurements:  Height, weight, and BMI.  Blood pressure.  Lipid and cholesterol levels. These may be checked every 5 years, or more frequently if you are over 13 years old.  Skin check.  Lung cancer screening. You may have this screening every year starting at age 53 if you have a 30-pack-year history of smoking and currently smoke or have quit within the past 15 years.  Fecal occult blood test (FOBT) of the stool. You may have this test every year starting at age 24.  Flexible sigmoidoscopy or colonoscopy. You may have a sigmoidoscopy every 5 years or a colonoscopy every 10 years starting at age 64.  Hepatitis C blood test.  Hepatitis B blood test.  Sexually transmitted disease (STD) testing.  Diabetes screening. This is done by checking your blood sugar (glucose) after you have not eaten for a while (fasting). You may have this done every 1-3 years.  Bone density scan. This is done to screen for osteoporosis. You may have this done starting at age 53.  Mammogram. This may be done every 1-2 years. Talk to your health care provider about how often you should have regular mammograms. Talk with your health care provider about your test results,  treatment options, and if necessary, the need for more tests. Vaccines  Your health care provider may recommend certain vaccines, such as:  Influenza vaccine. This is recommended every year.  Tetanus, diphtheria, and acellular  pertussis (Tdap, Td) vaccine. You may need a Td booster every 10 years.  Zoster vaccine. You may need this after age 46.  Pneumococcal 13-valent conjugate (PCV13) vaccine. One dose is recommended after age 85.  Pneumococcal polysaccharide (PPSV23) vaccine. One dose is recommended after age 66. Talk to your health care provider about which screenings and vaccines you need and how often you need them. This information is not intended to replace advice given to you by your health care provider. Make sure you discuss any questions you have with your health care provider. Document Released: 12/02/2015 Document Revised: 07/25/2016 Document Reviewed: 09/06/2015 Elsevier Interactive Patient Education  2017 [place] Prevention in the Home Falls can cause injuries. They can happen to people of all ages. There are many things you can do to make your home safe and to help prevent falls. What can I do on the outside of my home?  Regularly fix the edges of walkways and driveways and fix any cracks.  Remove anything that might make you trip as you walk through a door, such as a raised step or threshold.  Trim any bushes or trees on the path to your home.  Use bright outdoor lighting.  Clear any walking paths of anything that might make someone trip, such as rocks or tools.  Regularly check to see if handrails are loose or broken. Make sure that both sides of any steps have handrails.  Any raised decks and porches should have guardrails on the edges.  Have any leaves, snow, or ice cleared regularly.  Use sand or salt on walking paths during winter.  Clean up any spills in your garage right away. This includes oil or grease spills. What can I do in the bathroom?  Use night lights.  Install grab bars by the toilet and in the tub and shower. Do not use towel bars as grab bars.  Use non-skid mats or decals in the tub or shower.  If you need to sit down in the shower, use a plastic,  non-slip stool.  Keep the floor dry. Clean up any water that spills on the floor as soon as it happens.  Remove soap buildup in the tub or shower regularly.  Attach bath mats securely with double-sided non-slip rug tape.  Do not have throw rugs and other things on the floor that can make you trip. What can I do in the bedroom?  Use night lights.  Make sure that you have a light by your bed that is easy to reach.  Do not use any sheets or blankets that are too big for your bed. They should not hang down onto the floor.  Have a firm chair that has side arms. You can use this for support while you get dressed.  Do not have throw rugs and other things on the floor that can make you trip. What can I do in the kitchen?  Clean up any spills right away.  Avoid walking on wet floors.  Keep items that you use a lot in easy-to-reach places.  If you need to reach something above you, use a strong step stool that has a grab bar.  Keep electrical cords out of the way.  Do not use floor polish or wax that makes floors  slippery. If you must use wax, use non-skid floor wax.  Do not have throw rugs and other things on the floor that can make you trip. What can I do with my stairs?  Do not leave any items on the stairs.  Make sure that there are handrails on both sides of the stairs and use them. Fix handrails that are broken or loose. Make sure that handrails are as long as the stairways.  Check any carpeting to make sure that it is firmly attached to the stairs. Fix any carpet that is loose or worn.  Avoid having throw rugs at the top or bottom of the stairs. If you do have throw rugs, attach them to the floor with carpet tape.  Make sure that you have a light switch at the top of the stairs and the bottom of the stairs. If you do not have them, ask someone to add them for you. What else can I do to help prevent falls?  Wear shoes that:  Do not have high heels.  Have rubber  bottoms.  Are comfortable and fit you well.  Are closed at the toe. Do not wear sandals.  If you use a stepladder:  Make sure that it is fully opened. Do not climb a closed stepladder.  Make sure that both sides of the stepladder are locked into place.  Ask someone to hold it for you, if possible.  Clearly mark and make sure that you can see:  Any grab bars or handrails.  First and last steps.  Where the edge of each step is.  Use tools that help you move around (mobility aids) if they are needed. These include:  Canes.  Walkers.  Scooters.  Crutches.  Turn on the lights when you go into a dark area. Replace any light bulbs as soon as they burn out.  Set up your furniture so you have a clear path. Avoid moving your furniture around.  If any of your floors are uneven, fix them.  If there are any pets around you, be aware of where they are.  Review your medicines with your doctor. Some medicines can make you feel dizzy. This can increase your chance of falling. Ask your doctor what other things that you can do to help prevent falls. This information is not intended to replace advice given to you by your health care provider. Make sure you discuss any questions you have with your health care provider. Document Released: 09/01/2009 Document Revised: 04/12/2016 Document Reviewed: 12/10/2014 Elsevier Interactive Patient Education  2017 [employer].

## 2017-03-21 NOTE — Progress Notes (Signed)
Subjective:  HPI  Hypertension, follow-up:  BP Readings from Last 3 Encounters:  03/21/17 (!) 162/74  03/21/17 (!) 172/60  12/10/16 (!) 168/60    She was last seen for hypertension 3 months ago.  BP at that visit was 168/60. Management since that visit includes none. She reports good compliance with treatment. She is not having side effects.  She is exercising. She is adherent to low salt diet.   Outside blood pressures are not being checked. She is experiencing chest pain, dyspnea and fatigue.  Patient denies claudication, irregular heart beat, lower extremity edema, near-syncope, orthopnea, palpitations, paroxysmal nocturnal dyspnea, syncope and tachypnea.   Wt Readings from Last 3 Encounters:  03/21/17 140 lb (63.5 kg)  03/21/17 140 lb 12.8 oz (63.9 kg)  12/10/16 141 lb (64 kg)   ------------------------------------------------------------------------ Pt reports that she has to walk up an incline to get to her car at church and the last 2 Sundays that she has done it she has felt weak and felt like she could not walk another step. She says she feels a little short of breath and a dull pain in her chest. She says this felt similar to when she needed her pacemaker in the past.    Prior to Admission medications   Medication Sig Start Date End Date Taking? Authorizing Provider  acetaminophen (TYLENOL) 650 MG CR tablet Take 650 mg by mouth 2 (two) times daily.    Yes Historical Provider, MD  amLODipine (NORVASC) 5 MG tablet Take 1 tablet (5 mg total) by mouth daily. 04/20/16  Yes Richard Maceo Pro., MD  aspirin 81 MG tablet Take by mouth. 09/18/11  Yes Historical Provider, MD  magnesium oxide (MAG-OX) 400 MG tablet Take by mouth. 09/18/11  Yes Historical Provider, MD  MULTIPLE VITAMINS PO Take by mouth. 09/18/11  Yes Historical Provider, MD  omeprazole (PRILOSEC) 20 MG capsule TAKE 1 CAPSULE BY MOUTH DAILY 08/02/16  Yes Jerrol Banana., MD  pravastatin (PRAVACHOL) 40  MG tablet TAKE 1 TABLET DAILY 04/24/16  Yes Jerrol Banana., MD  quinapril-hydrochlorothiazide (ACCURETIC) 20-12.5 MG tablet Take 2 tablets by mouth daily. Patient taking differently: Take 1 tablet by mouth daily.  04/30/16  Yes Richard Maceo Pro., MD  rOPINIRole (REQUIP) 1 MG tablet Take 1 tablet (1 mg total) by mouth at bedtime. 10/01/16  Yes Richard Maceo Pro., MD    Patient Active Problem List   Diagnosis Date Noted  . Chest pain 06/06/2016  . Memory loss or impairment 07/11/2015  . Arthritis 05/18/2015  . Arthropathy of hand 05/18/2015  . CAD in native artery 05/18/2015  . Carpal tunnel syndrome 05/18/2015  . DDD (degenerative disc disease), cervical 05/18/2015  . Clinical depression 05/18/2015  . Gastro-esophageal reflux disease without esophagitis 05/18/2015  . Essential (primary) hypertension 05/18/2015  . Herpes zona 05/18/2015  . Adaptive colitis 05/18/2015  . Hypercholesteremia 05/18/2015  . 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 Fluttering heart 05/18/2015  . Restless leg 05/18/2015  . Apnea, sleep 05/18/2015  . Umbilical hernia without obstruction and without gangrene 05/18/2015  . TI (tricuspid incompetence) 05/04/2015  . Obstructive apnea 09/30/2014  . MI (mitral incompetence) 09/30/2014  . Artificial cardiac pacemaker 01/27/2014  . 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 No pertinent past medical history.  Social History   Social History  . Marital status: Married    Spouse name: N/A  . Number of children: N/A  . Years of  education: N/A   Occupational History  . Not on file.   Social History Main Topics  . Smoking status: Never Smoker  . Smokeless tobacco: Never Used  . Alcohol use No  . Drug use: No  . Sexual activity: Not on file   Other Topics Concern  . Not on file   Social History Narrative  . No narrative on file    Allergies  Allergen Reactions  . Ciprofloxacin   . Meloxicam   . Amoxicillin  Swelling and Rash  . Entex Lq  [Phenylephrine-Guaifenesin] Swelling and Rash    Review of Systems  Constitutional: Negative.   HENT: Negative.   Eyes: Negative.   Respiratory: Positive for shortness of breath.   Cardiovascular: Positive for chest pain.  Gastrointestinal: Negative.   Genitourinary: Positive for frequency.  Musculoskeletal: Positive for back pain.  Skin: Negative.   Neurological: Negative.   Endo/Heme/Allergies: Negative.   Psychiatric/Behavioral: Negative.     Immunization History  Administered Date(s) Administered  . Influenza Whole 08/14/2016  . Influenza, High Dose Seasonal PF 08/01/2015  . Pneumococcal Conjugate-13 10/04/2014  . Pneumococcal Polysaccharide-23 09/22/2005  . Td 02/09/2004  . Zoster 08/09/2008    Objective:  BP (!) 162/74   Pulse 80   Temp 98.9 F (37.2 C) (Oral)   Resp 16   Ht 5\' 2"  (1.575 m)   Wt 140 lb (63.5 kg)   SpO2 98%   BMI 25.61 kg/m   Physical Exam  Constitutional: She is oriented to person, place, and time and well-developed, well-nourished, and in no distress.  HENT:  Head: Normocephalic and atraumatic.  Right Ear: External ear normal.  Left Ear: External ear normal.  Nose: Nose normal.  Eyes: Conjunctivae and EOM are normal. Pupils are equal, round, and reactive to light.  Neck: Normal range of motion. Neck supple.  Cardiovascular: Normal rate, regular rhythm, normal heart sounds and intact distal pulses.   Pulmonary/Chest: Effort normal and breath sounds normal.  Abdominal: Soft. Bowel sounds are normal.  Small umbilical hernia.   Musculoskeletal: Normal range of motion. She exhibits no tenderness (no CVA tenderness).  Neurological: She is alert and oriented to person, place, and time. She has normal reflexes. Gait normal. GCS score is 15.  Skin: Skin is warm and dry.  Psychiatric: Mood, memory, affect and judgment normal.    Lab Results  Component Value Date   WBC 5.7 03/21/2016   HGB 12.9 10/04/2014   HCT  38.2 03/21/2016   PLT 291 03/21/2016   GLUCOSE 105 (H) 03/21/2016   CHOL 200 (H) 03/21/2016   TRIG 120 03/21/2016   HDL 75 03/21/2016   LDLCALC 101 (H) 03/21/2016   TSH 1.060 03/21/2016    CMP     Component Value Date/Time   NA 139 03/21/2016 1054   K 4.1 03/21/2016 1054   K 3.6 01/09/2012 1010   CL 98 03/21/2016 1054   CO2 24 03/21/2016 1054   GLUCOSE 105 (H) 03/21/2016 1054   BUN 13 03/21/2016 1054   CREATININE 0.55 (L) 03/21/2016 1054   CALCIUM 9.8 03/21/2016 1054   PROT 6.8 03/21/2016 1054   ALBUMIN 4.5 03/21/2016 1054   AST 17 03/21/2016 1054   ALT 13 03/21/2016 1054   ALKPHOS 59 03/21/2016 1054   BILITOT 0.5 03/21/2016 1054   GFRNONAA 86 03/21/2016 1054   GFRAA 100 03/21/2016 1054    Assessment and Plan :  1. Essential (primary) hypertension  - CBC with Differential/Platelet - TSH  2. Restless leg  syndrome   3. Arthritis   4. Hypercholesteremia  - Comprehensive metabolic panel - Lipid Panel With LDL/HDL Ratio  5. Chest pain, unspecified type/Exertional fatigue/DOE  - EKG 12-Lead - Ambulatory referral to Cardiology  6. Weakness  - Ambulatory referral to Cardiology  7. Acute left-sided low back pain without sciatica  - POCT urinalysis dipstick  8. Flank pain  - Urine culture - DG Abd 1 View; Future  9. Hematuria, unspecified type - Urine culture 10.Pacemaker functional  HPI, Exam, and A&P Transcribed under the direction and in the presence of Richard L. Cranford Mon, MD  Electronically Signed: Katina Dung, Peterson MD [facility] 03/21/2017 10:22 AM

## 2017-03-21 NOTE — Progress Notes (Signed)
Subjective:   Melanie Franklin is a 83 y.o. female who presents for Medicare Annual (Subsequent) preventive examination.  Review of Systems:  N/A Cardiac Risk Factors include: advanced age (>36men, >35 women);dyslipidemia;hypertension     Objective:     Vitals: BP (!) 172/60 (BP Location: Right Arm)   Pulse 80   Temp 98.9 F (37.2 C) (Oral)   Ht 5\' 2"  (1.575 m)   Wt 140 lb 12.8 oz (63.9 kg)   BMI 25.75 kg/m   Body mass index is 25.75 kg/m.   Tobacco History  Smoking Status  . Never Smoker  Smokeless Tobacco  . Never Used     Counseling given: Not Answered   History reviewed. No pertinent past medical history. Past Surgical History:  Procedure Laterality Date  . CATARACT EXTRACTION Bilateral   . CORONARY ARTERY BYPASS GRAFT  03/09/2009   x [facility]  . ESOPHAGEAL DILATION    . KNEE ARTHROSCOPY Left   . LASIK    . PACEMAKER INSERTION    . TONSILLECTOMY     Family History  Problem Relation Age of Onset  . Hypertension Mother   . Colon cancer Mother   . Cerebrovascular Accident Mother   . Ulcers Mother   . Other Mother     nervous breakdown  . Stroke Father   . Congestive Heart Failure Sister   . Kidney Stones Sister   . Cancer Sister     oral  . Hypertension Brother   . Hypertension Sister   . Diabetes Sister   . CAD Sister   . Other Sister    History  Sexual Activity  . Sexual activity: Not on file    Outpatient Encounter Prescriptions as of 03/21/2017  Medication Sig  . acetaminophen (TYLENOL) 650 MG CR tablet Take 650 mg by mouth 2 (two) times daily.   Marland Kitchen amLODipine (NORVASC) 5 MG tablet Take 1 tablet (5 mg total) by mouth daily.  Marland Kitchen aspirin 81 MG tablet Take by mouth.  . magnesium oxide (MAG-OX) 400 MG tablet Take by mouth.  . MULTIPLE VITAMINS PO Take by mouth.  Marland Kitchen omeprazole (PRILOSEC) 20 MG capsule TAKE 1 CAPSULE BY MOUTH DAILY  . pravastatin (PRAVACHOL) 40 MG tablet TAKE 1 TABLET DAILY  . quinapril-hydrochlorothiazide  (ACCURETIC) 20-12.5 MG tablet Take 2 tablets by mouth daily. (Patient taking differently: Take 1 tablet by mouth daily. )  . rOPINIRole (REQUIP) 1 MG tablet Take 1 tablet (1 mg total) by mouth at bedtime.   No facility-administered encounter medications on file as of 03/21/2017.     Activities of Daily Living In your present state of health, do you have any difficulty performing the following activities: 03/21/2017 03/21/2016  Hearing? N N  Vision? N N  Difficulty concentrating or making decisions? N N  Walking or climbing stairs? N N  Dressing or bathing? N Y  Doing errands, shopping? N N  Preparing Food and eating ? N -  Using the Toilet? N -  In the past six months, have you accidently leaked urine? Y -  Do you have problems with loss of bowel control? N -  Managing your Medications? N -  Managing your Finances? N -  Housekeeping or managing your Housekeeping? N -  Some recent data might be hidden    Patient Care Team: Jerrol Banana., MD as PCP - General (Family Medicine) Estill Cotta, MD as Consulting Physician (Ophthalmology) Corey Skains, MD as Consulting Physician (Cardiology)  Assessment:      Exercise Activities and Dietary recommendations Current Exercise Habits: The patient does not participate in regular exercise at present (yardwork and house work), Exercise limited by: None identified  Goals    . Increase water intake          Recommend increasing water intake to 4-5 glasses a day.      Fall Risk Fall Risk  03/21/2017 03/21/2016  Falls in the past year? No Yes  Number falls in past yr: - 1  Injury with Fall? - Yes   Depression Screen PHQ 2/9 Scores 03/21/2017 03/21/2017 03/21/2016  PHQ - 2 Score 1 1 0  PHQ- 9 Score 1 - -     Cognitive Function     6CIT Screen 03/21/2017  What Year? 0 points  What month? 0 points  What time? 0 points  Count back from 20 0 points  Months in reverse 0 points  Repeat phrase 6 points  Total Score 6     Immunization History  Administered Date(s) Administered  . Influenza Whole 08/14/2016  . Influenza, High Dose Seasonal PF 08/01/2015  . Pneumococcal Conjugate-13 10/04/2014  . Pneumococcal Polysaccharide-23 09/22/2005  . Td 02/09/2004  . Zoster 08/09/2008   Screening Tests Health Maintenance  Topic Date Due  . TETANUS/TDAP  11/19/2026 (Originally 02/08/2014)  . INFLUENZA VACCINE  06/19/2017  . DEXA SCAN  Completed  . PNA vac Low Risk Adult  Completed      Plan:  I have personally reviewed and addressed the Medicare Annual Wellness questionnaire and have noted the following in the patient's chart:  A. Medical and social history B. Use of alcohol, tobacco or illicit drugs  C. Current medications and supplements D. Functional ability and status E.  Nutritional status F.  Physical activity G. Advance directives H. List of other physicians I.  Hospitalizations, surgeries, and ER visits in previous 12 months J.  [place] such as hearing and vision if needed, cognitive and depression L. Referrals and appointments - none  In addition, I have reviewed and discussed with patient certain preventive protocols, quality metrics, and best practice recommendations. A written personalized care plan for preventive services as well as general preventive health recommendations were provided to patient.  See attached scanned questionnaire for additional information.   Signed,  Fabio Neighbors, LPN Nurse Health Advisor   MD Recommendations: None. Pt declined tetanus vaccine today. I have reviewed the health advisors note, was  available for consultation and I agree with documentation and plan. Miguel Aschoff MD [facility]

## 2017-03-22 LAB — COMPREHENSIVE METABOLIC PANEL
A/G RATIO: 1.9 (ref 1.2–2.2)
ALT: 16 IU/L (ref 0–32)
AST: 21 IU/L (ref 0–40)
Albumin: 4.7 g/dL (ref 3.5–4.7)
Alkaline Phosphatase: 67 IU/L (ref 39–117)
BUN/Creatinine Ratio: 29 — ABNORMAL HIGH (ref 12–28)
BUN: 17 mg/dL (ref 8–27)
Bilirubin Total: 0.5 mg/dL (ref 0.0–1.2)
CALCIUM: 10 mg/dL (ref 8.7–10.3)
CHLORIDE: 97 mmol/L (ref 96–106)
CO2: 26 mmol/L (ref 18–29)
Creatinine, Ser: 0.59 mg/dL (ref 0.57–1.00)
GFR calc Af Amer: 97 mL/min/{1.73_m2} (ref >59)
GFR, EST NON AFRICAN AMERICAN: 84 mL/min/{1.73_m2} (ref >59)
GLUCOSE: 104 mg/dL — AB (ref 65–99)
Globulin, Total: 2.5 g/dL (ref 1.5–4.5)
POTASSIUM: 4.2 mmol/L (ref 3.5–5.2)
Sodium: 138 mmol/L (ref 134–144)
Total Protein: 7.2 g/dL (ref 6.0–8.5)

## 2017-03-22 LAB — LIPID PANEL WITH LDL/HDL RATIO
CHOLESTEROL TOTAL: 207 mg/dL — AB (ref 100–199)
HDL: 77 mg/dL (ref >39)
LDL Calculated: 105 mg/dL — ABNORMAL HIGH (ref 0–99)
LDl/HDL Ratio: 1.4 ratio (ref 0.0–3.2)
TRIGLYCERIDES: 123 mg/dL (ref 0–149)
VLDL Cholesterol Cal: 25 mg/dL (ref 5–40)

## 2017-03-22 LAB — CBC WITH DIFFERENTIAL/PLATELET
BASOS ABS: 0 10*3/uL (ref 0.0–0.2)
BASOS: 0 %
EOS (ABSOLUTE): 0 10*3/uL (ref 0.0–0.4)
Eos: 1 %
Hematocrit: 39 % (ref 34.0–46.6)
Hemoglobin: 13.7 g/dL (ref 11.1–15.9)
Immature Grans (Abs): 0 10*3/uL (ref 0.0–0.1)
Immature Granulocytes: 0 %
LYMPHS: 32 %
Lymphocytes Absolute: 2.3 10*3/uL (ref 0.7–3.1)
MCH: 32.5 pg (ref 26.6–33.0)
MCHC: 35.1 g/dL (ref 31.5–35.7)
MCV: 92 fL (ref 79–97)
Monocytes Absolute: 0.7 10*3/uL (ref 0.1–0.9)
Monocytes: 9 %
NEUTROS PCT: 58 %
Neutrophils Absolute: 4.1 10*3/uL (ref 1.4–7.0)
PLATELETS: 267 10*3/uL (ref 150–379)
RBC: 4.22 x10E6/uL (ref 3.77–5.28)
RDW: 12.9 % (ref 12.3–15.4)
WBC: 7.2 10*3/uL (ref 3.4–10.8)

## 2017-03-22 LAB — TSH: TSH: 1.13 u[IU]/mL (ref 0.450–4.500)

## 2017-03-23 LAB — URINE CULTURE

## 2017-04-04 ENCOUNTER — Ambulatory Visit
Admission: RE | Admit: 2017-04-04 | Discharge: 2017-04-04 | Disposition: A | Discharge status: Home / Self Care | Payer: Medicare Other | Source: Ambulatory Visit | Attending: Family Medicine | Admitting: Family Medicine

## 2017-04-04 DIAGNOSIS — Z1231 Encounter for screening mammogram for malignant neoplasm of breast: Secondary | ICD-10-CM | POA: Diagnosis not present

## 2017-04-17 ENCOUNTER — Ambulatory Visit (INDEPENDENT_AMBULATORY_CARE_PROVIDER_SITE_OTHER): Payer: Medicare Other | Admitting: Family Medicine

## 2017-04-17 ENCOUNTER — Encounter: Payer: Self-pay | Admitting: Family Medicine

## 2017-04-17 VITALS — BP 122/50 | HR 84 | Temp 98.0°F | Resp 16 | Wt 138.0 lb

## 2017-04-17 DIAGNOSIS — I2510 Atherosclerotic heart disease of native coronary artery without angina pectoris: Secondary | ICD-10-CM | POA: Diagnosis not present

## 2017-04-17 DIAGNOSIS — I10 Essential (primary) hypertension: Secondary | ICD-10-CM | POA: Diagnosis not present

## 2017-04-17 NOTE — Progress Notes (Signed)
Patient: Melanie Franklin Female    DOB: Nov 19, 1929   83 y.o.   MRN: [id number] Visit Date: 04/17/2017  Today's Provider: Wilhemena Durie, MD   Chief Complaint  Patient presents with  . Hypertension   Subjective:    HPI      Hypertension, follow-up:  BP Readings from Last 3 Encounters:  04/17/17 (!) 122/50  03/21/17 (!) 162/74  03/21/17 (!) 172/60    She was last seen for hypertension 4 weeks ago.  BP at that visit was 162/74. Management since that visit includes advising pt to take Quinapril/HCTZ 20-12.5 mg as prescribed, which is 2 tablets daily. Pt was only taking 1 tab daily. She reports poor compliance with treatment. Pt is still taking 1 tablet daily. She states the rx label from the pharmacy only says to take 1 tablet daily. She is not having side effects.  She is not exercising. Pt does try to stay busy doing housework, yard work. She is adherent to low salt diet.   She is experiencing chest pain and fatigue. Chest pain is an intermittent sharp pain. Pt has pacemaker. EKG was checked at [facility] for this problem. Patient denies chest pressure/discomfort, claudication, dyspnea, exertional chest pressure/discomfort, irregular heart beat, lower extremity edema, near-syncope, orthopnea, palpitations and syncope.   Cardiovascular risk factors include advanced age (older than 74 for men, 25 for women), dyslipidemia and hypertension.    Weight trend: stable Wt Readings from Last 3 Encounters:  04/17/17 138 lb (62.6 kg)  03/21/17 140 lb (63.5 kg)  03/21/17 140 lb 12.8 oz (63.9 kg)    Current diet: in general, a "healthy" diet    ------------------------------------------------------------------------   Allergies  Allergen Reactions  . Ciprofloxacin   . Meloxicam   . Amoxicillin Swelling and Rash  . Entex Lq  [Phenylephrine-Guaifenesin] Swelling and Rash     Current Outpatient Prescriptions:  .  acetaminophen (TYLENOL) 650 MG CR tablet, Take 650 mg by mouth  2 (two) times daily. , Disp: , Rfl:  .  amLODipine (NORVASC) 5 MG tablet, Take 1 tablet (5 mg total) by mouth daily., Disp: 90 tablet, Rfl: 3 .  aspirin 81 MG tablet, Take by mouth., Disp: , Rfl:  .  magnesium oxide (MAG-OX) 400 MG tablet, Take by mouth., Disp: , Rfl:  .  MULTIPLE VITAMINS PO, Take by mouth., Disp: , Rfl:  .  omeprazole (PRILOSEC) 20 MG capsule, TAKE 1 CAPSULE BY MOUTH DAILY, Disp: 30 capsule, Rfl: 5 .  pravastatin (PRAVACHOL) 40 MG tablet, TAKE 1 TABLET DAILY, Disp: 90 tablet, Rfl: 3 .  quinapril-hydrochlorothiazide (ACCURETIC) 20-12.5 MG tablet, Take 2 tablets by mouth daily. (Patient taking differently: Take 1 tablet by mouth daily. ), Disp: 180 tablet, Rfl: 3 .  rOPINIRole (REQUIP) 1 MG tablet, Take 1 tablet (1 mg total) by mouth at bedtime., Disp: 30 tablet, Rfl: 5  Review of Systems  Constitutional: Positive for fatigue. Negative for activity change, appetite change, chills, diaphoresis, fever and unexpected weight change.  Respiratory: Negative for shortness of breath.   Cardiovascular: Positive for chest pain (worked up at [place] [facility]; stable per pt ). Negative for palpitations and leg swelling.    Social History  Substance Use Topics  . Smoking status: Never Smoker  . Smokeless tobacco: Never Used  . Alcohol use No   Objective:   BP (!) 122/50 (BP Location: Left Arm, Patient Position: Sitting, Cuff Size: Normal)   Pulse 84   Temp 98 F (36.7 C) (  Oral)   Resp 16   Wt 138 lb (62.6 kg)   BMI 25.24 kg/m  Vitals:   04/17/17 1109  BP: (!) 122/50  Pulse: 84  Resp: 16  Temp: 98 F (36.7 C)  TempSrc: Oral  Weight: 138 lb (62.6 kg)     Physical Exam  Constitutional: She appears well-developed and well-nourished.  HENT:  Head: Normocephalic.  Eyes: Conjunctivae are normal.  Neck: Normal range of motion.  Cardiovascular: Normal rate, regular rhythm and normal heart sounds.   No murmur heard. Pulmonary/Chest: Effort normal and breath sounds normal. No  respiratory distress.  Musculoskeletal: Normal range of motion. She exhibits no edema.  Psychiatric: She has a normal mood and affect. Her behavior is normal.        Assessment & Plan:     1. Essential (primary) hypertension BP is stable today, but pt reports her BP readings at home are still elevated. Will increase Quinapril-HCTZ to 2 tablets daily. Monitor BP at home once weekly. Pt has a cardiologist appointment in 3 months; FU with Dr. Nehemiah Massed at that time. Will see pt back in 6 months, or sooner if needed.     I have done the exam and reviewed the above chart and it is accurate to the best of my knowledge. Development worker, community has been used in this note in any air is in the dictation or transcription are unintentional.  Wilhemena Durie, MD  [place]

## 2017-04-17 NOTE — Patient Instructions (Addendum)
Check your blood pressures once weekly. Increase your Quinapril-HCTZ to 2 tablets a day. Your goal top number of your blood pressure is below 160. If you start feeling dizzy or lightheaded, only take one Quinapril-HCTZ daily. Follow up with your cardiologist in 3 months, and bring your blood pressure readings. Call our office for problems or questions.

## 2017-04-19 DIAGNOSIS — I10 Essential (primary) hypertension: Secondary | ICD-10-CM | POA: Diagnosis not present

## 2017-04-19 DIAGNOSIS — I442 Atrioventricular block, complete: Secondary | ICD-10-CM | POA: Diagnosis not present

## 2017-04-19 DIAGNOSIS — I25708 Atherosclerosis of coronary artery bypass graft(s), unspecified, with other forms of angina pectoris: Secondary | ICD-10-CM | POA: Diagnosis not present

## 2017-04-26 ENCOUNTER — Other Ambulatory Visit: Payer: Self-pay | Admitting: Family Medicine

## 2017-04-26 DIAGNOSIS — K219 Gastro-esophageal reflux disease without esophagitis: Secondary | ICD-10-CM

## 2017-04-26 NOTE — Telephone Encounter (Signed)
Melanie Franklin faxed a refill request on the following medications:  amLODipine (NORVASC) 5 MG tablet.  Take 1 tablet by mouth daily.  90 day supply.  Melanie Franklin

## 2017-05-14 DIAGNOSIS — R0602 Shortness of breath: Secondary | ICD-10-CM | POA: Diagnosis not present

## 2017-05-14 DIAGNOSIS — I25708 Atherosclerosis of coronary artery bypass graft(s), unspecified, with other forms of angina pectoris: Secondary | ICD-10-CM | POA: Diagnosis not present

## 2017-05-14 DIAGNOSIS — I25810 Atherosclerosis of coronary artery bypass graft(s) without angina pectoris: Secondary | ICD-10-CM | POA: Diagnosis not present

## 2017-05-14 DIAGNOSIS — E782 Mixed hyperlipidemia: Secondary | ICD-10-CM | POA: Diagnosis not present

## 2017-05-14 DIAGNOSIS — I10 Essential (primary) hypertension: Secondary | ICD-10-CM | POA: Diagnosis not present

## 2017-05-30 ENCOUNTER — Other Ambulatory Visit: Payer: Self-pay | Admitting: Family Medicine

## 2017-06-18 DIAGNOSIS — I25810 Atherosclerosis of coronary artery bypass graft(s) without angina pectoris: Secondary | ICD-10-CM | POA: Diagnosis not present

## 2017-06-18 DIAGNOSIS — I10 Essential (primary) hypertension: Secondary | ICD-10-CM | POA: Diagnosis not present

## 2017-06-18 DIAGNOSIS — I442 Atrioventricular block, complete: Secondary | ICD-10-CM | POA: Diagnosis not present

## 2017-06-28 ENCOUNTER — Ambulatory Visit
Admission: RE | Admit: 2017-06-28 | Discharge: 2017-06-28 | Disposition: A | Discharge status: Home / Self Care | Payer: Medicare Other | Source: Ambulatory Visit | Attending: Cardiology | Admitting: Cardiology

## 2017-06-28 ENCOUNTER — Ambulatory Visit: Admission: RE | Admit: 2017-06-28 | Payer: Medicare Other | Source: Ambulatory Visit | Admitting: Cardiology

## 2017-06-28 ENCOUNTER — Encounter
Admission: RE | Admit: 2017-06-28 | Discharge: 2017-06-28 | Disposition: A | Discharge status: Home / Self Care | Payer: Medicare Other | Source: Ambulatory Visit | Attending: Cardiology | Admitting: Cardiology

## 2017-06-28 DIAGNOSIS — I2510 Atherosclerotic heart disease of native coronary artery without angina pectoris: Secondary | ICD-10-CM

## 2017-06-28 DIAGNOSIS — Z01818 Encounter for other preprocedural examination: Secondary | ICD-10-CM | POA: Diagnosis not present

## 2017-06-28 DIAGNOSIS — Z951 Presence of aortocoronary bypass graft: Secondary | ICD-10-CM | POA: Diagnosis not present

## 2017-06-28 DIAGNOSIS — Z950 Presence of cardiac pacemaker: Secondary | ICD-10-CM | POA: Diagnosis not present

## 2017-06-28 HISTORY — DX: Atherosclerotic heart disease of native coronary artery without angina pectoris: I25.10

## 2017-06-28 HISTORY — DX: Essential (primary) hypertension: I10

## 2017-06-28 HISTORY — DX: Gastro-esophageal reflux disease without esophagitis: K21.9

## 2017-06-28 HISTORY — DX: Presence of cardiac pacemaker: Z95.0

## 2017-06-28 HISTORY — DX: Family history of other specified conditions: Z84.89

## 2017-06-28 LAB — BASIC METABOLIC PANEL
ANION GAP: 9 (ref 5–15)
BUN: 17 mg/dL (ref 6–20)
CALCIUM: 9.6 mg/dL (ref 8.9–10.3)
CO2: 28 mmol/L (ref 22–32)
Chloride: 102 mmol/L (ref 101–111)
Creatinine, Ser: 0.56 mg/dL (ref 0.44–1.00)
GFR calc Af Amer: >60 mL/min (ref >60)
Glucose, Bld: 104 mg/dL — ABNORMAL HIGH (ref 65–99)
POTASSIUM: 3.8 mmol/L (ref 3.5–5.1)
SODIUM: 139 mmol/L (ref 135–145)

## 2017-06-28 LAB — CBC
HCT: 39 % (ref 35.0–47.0)
Hemoglobin: 13.3 g/dL (ref 12.0–16.0)
MCH: 32.6 pg (ref 26.0–34.0)
MCHC: 34.1 g/dL (ref 32.0–36.0)
MCV: 95.7 fL (ref 80.0–100.0)
PLATELETS: 271 10*3/uL (ref 150–440)
RBC: 4.07 MIL/uL (ref 3.80–5.20)
RDW: 13 % (ref 11.5–14.5)
WBC: 5.7 10*3/uL (ref 3.6–11.0)

## 2017-06-28 LAB — SURGICAL PCR SCREEN
MRSA, PCR: NEGATIVE
STAPHYLOCOCCUS AUREUS: NEGATIVE

## 2017-06-28 LAB — PROTIME-INR
INR: 0.89
PROTHROMBIN TIME: 12 s (ref 11.4–15.2)

## 2017-06-28 LAB — APTT: APTT: 27 s (ref 24–36)

## 2017-06-28 NOTE — Patient Instructions (Signed)
  Your procedure is scheduled [id number] August 24 , 2018. Report to Same Day Surgery. To find out your arrival time please call [phone] between 1PM - 3PM on Thursday July 11, 2017.  Remember: Instructions that are not followed completely may result in serious medical risk, up to and including death, or upon the discretion of your surgeon and anesthesiologist your surgery may need to be rescheduled.    _x___ 1. Do not eat food or drink liquids after midnight. No gum chewing or hard candies.     ____ 2. No Alcohol for 24 hours before or after surgery.   ____ 3. Bring all medications with you on the day of surgery if instructed.    __x__ 4. Notify your doctor if there is any change in your medical condition     (cold, fever, infections).    _____ 5. No smoking 24 hours prior to surgery.     Do not wear jewelry, make-up, hairpins, clips or nail polish.  Do not wear lotions, powders, or perfumes.   Do not shave 48 hours prior to surgery. Men may shave face and neck.  Do not bring valuables to the hospital.    [facility] is not responsible for any belongings or valuables.               Contacts, dentures or bridgework may not be worn into surgery.  Leave your suitcase in the car. After surgery it may be brought to your room.  For patients admitted to the hospital, discharge time is determined by your treatment team.   Patients discharged the day of surgery will not be allowed to drive home.    Please read over the following fact sheets that you were given:   [facility] Preparing for Surgery  _x___ Take these medicines the morning of surgery with A SIP OF WATER: As instructed by Dr. Marcello Moores.     ____ Fleet Enema (as directed)   _x___ Use CHG Soap as directed on instruction sheet  ____ Use inhalers on the day of surgery and bring to hospital day of surgery  ____ Stop metformin 2 days prior to surgery    ____ Take 1/2 of usual insulin dose the night before surgery and  none on the morning of  surgery.   ____ Stop Coumadin/Plavix/aspirin on does not apply.  __x__ Stop Anti-inflammatories such as Advil, Aleve, Ibuprofen, Motrin, Naproxen, Naprosyn, Goodies powders or aspirin  products. OK to take Tylenol.   ____ Stop supplements until after surgery.    ____ Bring C-Pap to the hospital.

## 2017-07-05 ENCOUNTER — Other Ambulatory Visit: Payer: Medicare Other

## 2017-07-12 ENCOUNTER — Encounter
Admission: RE | Disposition: A | Discharge status: Home / Self Care | Payer: Self-pay | Source: Ambulatory Visit | Attending: Cardiology

## 2017-07-12 ENCOUNTER — Encounter: Payer: Self-pay | Admitting: *Deleted

## 2017-07-12 ENCOUNTER — Ambulatory Visit
Admission: RE | Admit: 2017-07-12 | Discharge: 2017-07-12 | Disposition: A | Discharge status: Home / Self Care | Payer: Medicare Other | Source: Ambulatory Visit | Attending: Cardiology | Admitting: Cardiology

## 2017-07-12 ENCOUNTER — Ambulatory Visit: Payer: Medicare Other | Admitting: Anesthesiology

## 2017-07-12 DIAGNOSIS — M1990 Unspecified osteoarthritis, unspecified site: Secondary | ICD-10-CM | POA: Insufficient documentation

## 2017-07-12 DIAGNOSIS — Z951 Presence of aortocoronary bypass graft: Secondary | ICD-10-CM | POA: Diagnosis not present

## 2017-07-12 DIAGNOSIS — G4730 Sleep apnea, unspecified: Secondary | ICD-10-CM | POA: Insufficient documentation

## 2017-07-12 DIAGNOSIS — K219 Gastro-esophageal reflux disease without esophagitis: Secondary | ICD-10-CM | POA: Diagnosis not present

## 2017-07-12 DIAGNOSIS — E785 Hyperlipidemia, unspecified: Secondary | ICD-10-CM | POA: Insufficient documentation

## 2017-07-12 DIAGNOSIS — Z8249 Family history of ischemic heart disease and other diseases of the circulatory system: Secondary | ICD-10-CM | POA: Insufficient documentation

## 2017-07-12 DIAGNOSIS — Z7982 Long term (current) use of aspirin: Secondary | ICD-10-CM | POA: Insufficient documentation

## 2017-07-12 DIAGNOSIS — Z45010 Encounter for checking and testing of cardiac pacemaker pulse generator [battery]: Secondary | ICD-10-CM | POA: Insufficient documentation

## 2017-07-12 DIAGNOSIS — F329 Major depressive disorder, single episode, unspecified: Secondary | ICD-10-CM | POA: Diagnosis not present

## 2017-07-12 DIAGNOSIS — I442 Atrioventricular block, complete: Secondary | ICD-10-CM | POA: Insufficient documentation

## 2017-07-12 DIAGNOSIS — I10 Essential (primary) hypertension: Secondary | ICD-10-CM | POA: Diagnosis not present

## 2017-07-12 DIAGNOSIS — I2510 Atherosclerotic heart disease of native coronary artery without angina pectoris: Secondary | ICD-10-CM | POA: Insufficient documentation

## 2017-07-12 HISTORY — PX: PACEMAKER INSERTION: SHX728

## 2017-07-12 SURGERY — INSERTION, CARDIAC PACEMAKER
Anesthesia: General | Site: Chest | Laterality: Left | Wound class: Clean

## 2017-07-12 MED ORDER — FENTANYL CITRATE (PF) 100 MCG/2ML IJ SOLN: 25.0000 ug | INTRAMUSCULAR | Status: DC | PRN

## 2017-07-12 MED ORDER — ACETAMINOPHEN 325 MG PO TABS: 325.0000 mg | ORAL_TABLET | ORAL | Status: DC | PRN

## 2017-07-12 MED ORDER — MEPERIDINE HCL 50 MG/ML IJ SOLN: 6.2500 mg | INTRAMUSCULAR | Status: DC | PRN

## 2017-07-12 MED ORDER — PHENYLEPHRINE HCL 10 MG/ML IJ SOLN
INTRAMUSCULAR | Status: DC | PRN
  Administered 2017-07-12 (×2): 100 ug via INTRAVENOUS

## 2017-07-12 MED ORDER — SODIUM CHLORIDE 0.9 % IR SOLN
Freq: Once | Status: AC
  Administered 2017-07-12: 100 mL
  Filled 2017-07-12: qty 2

## 2017-07-12 MED ORDER — OXYCODONE HCL 5 MG PO TABS: 5.0000 mg | ORAL_TABLET | Freq: Once | ORAL | Status: DC | PRN

## 2017-07-12 MED ORDER — LACTATED RINGERS IV SOLN
INTRAVENOUS | Status: DC
  Administered 2017-07-12: 12:00:00 via INTRAVENOUS

## 2017-07-12 MED ORDER — LIDOCAINE 1 % OPTIME INJ - NO CHARGE
INTRAMUSCULAR | Status: DC | PRN
  Administered 2017-07-12: 20 mL

## 2017-07-12 MED ORDER — FENTANYL CITRATE (PF) 100 MCG/2ML IJ SOLN
INTRAMUSCULAR | Status: DC | PRN
  Administered 2017-07-12 (×4): 25 ug via INTRAVENOUS

## 2017-07-12 MED ORDER — PROMETHAZINE HCL 25 MG/ML IJ SOLN: 6.2500 mg | INTRAMUSCULAR | Status: DC | PRN

## 2017-07-12 MED ORDER — OXYCODONE HCL 5 MG/5ML PO SOLN: 5.0000 mg | Freq: Once | ORAL | Status: DC | PRN

## 2017-07-12 MED ORDER — VANCOMYCIN HCL IN DEXTROSE 1-5 GM/200ML-% IV SOLN
1000.0000 mg | Freq: Once | INTRAVENOUS | Status: AC
  Administered 2017-07-12: 1000 mg via INTRAVENOUS

## 2017-07-12 MED ORDER — PROPOFOL 500 MG/50ML IV EMUL
INTRAVENOUS | Status: DC | PRN
  Administered 2017-07-12: 100 ug/kg/min via INTRAVENOUS

## 2017-07-12 MED ORDER — SODIUM CHLORIDE 0.9 % IV SOLN
INTRAVENOUS | Status: DC
Start: 2017-07-12 — End: 2017-07-12

## 2017-07-12 SURGICAL SUPPLY — 40 items
BAG DECANTER FOR FLEXI CONT (MISCELLANEOUS) ×3 IMPLANT
BLADE SURG SZ10 CARB STEEL (BLADE) ×3 IMPLANT
BRUSH SCRUB EZ  4% CHG (MISCELLANEOUS) ×2
BRUSH SCRUB EZ 4% CHG (MISCELLANEOUS) ×1 IMPLANT
CABLE SURG 12 DISP A/V CHANNEL (MISCELLANEOUS) ×3 IMPLANT
CANISTER SUCT 1200ML W/VALVE (MISCELLANEOUS) ×3 IMPLANT
CHLORAPREP W/TINT 26ML (MISCELLANEOUS) ×3 IMPLANT
COVER LIGHT HANDLE STERIS (MISCELLANEOUS) ×6 IMPLANT
COVER MAYO STAND STRL (DRAPES) ×3 IMPLANT
DRAPE C-ARM XRAY 36X54 (DRAPES) IMPLANT
DRSG TEGADERM 4X4.75 (GAUZE/BANDAGES/DRESSINGS) ×3 IMPLANT
DRSG TELFA 4X3 1S NADH ST (GAUZE/BANDAGES/DRESSINGS) ×3 IMPLANT
ELECT REM PT RETURN 9FT ADLT (ELECTROSURGICAL) ×3
ELECTRODE REM PT RTRN 9FT ADLT (ELECTROSURGICAL) ×1 IMPLANT
GLOVE BIO SURGEON STRL SZ7.5 (GLOVE) ×12 IMPLANT
GOWN STRL REUS W/ TWL LRG LVL3 (GOWN DISPOSABLE) ×2 IMPLANT
GOWN STRL REUS W/TWL LRG LVL3 (GOWN DISPOSABLE) ×4
IMMOBILIZER SHDR MD LX WHT (SOFTGOODS) ×3 IMPLANT
IPG PACE AZUR XT DR MRI W1DR01 (Pacemaker) ×1 IMPLANT
IV NS 1000ML (IV SOLUTION)
IV NS 1000ML BAXH (IV SOLUTION) IMPLANT
KIT RM TURNOVER STRD PROC AR (KITS) ×3 IMPLANT
LABEL OR SOLS (LABEL) ×3 IMPLANT
LEAD INTRODUCER 7FR 23CM (INTRODUCER) ×3 IMPLANT
MARKER SKIN DUAL TIP RULER LAB (MISCELLANEOUS) ×3 IMPLANT
NDL SAFETY 18GX1.5 (NEEDLE) ×3 IMPLANT
NEEDLE FILTER BLUNT 18X 1/2SAF (NEEDLE) ×2
NEEDLE FILTER BLUNT 18X1 1/2 (NEEDLE) ×1 IMPLANT
NEEDLE HYPO 25X1 1.5 SAFETY (NEEDLE) ×3 IMPLANT
NEEDLE SPNL 18GX3.5 QUINCKE PK (NEEDLE) IMPLANT
NS IRRIG 500ML POUR BTL (IV SOLUTION) ×3 IMPLANT
PACE AZURE XT DR MRI W1DR01 (Pacemaker) ×3 IMPLANT
PACK PACE INSERTION (MISCELLANEOUS) ×3 IMPLANT
PAD STATPAD (MISCELLANEOUS) ×3 IMPLANT
SLING ARM LRG DEEP (SOFTGOODS) IMPLANT
SUT DVC V-LOC 4-0 90 CLR P-12 (SUTURE) ×3
SUT DVC VLOC 3-0 CL 6 P-12 (SUTURE) ×3 IMPLANT
SUT SILK 0 SH 30 (SUTURE) ×3 IMPLANT
SUTURE DVC V-LC4-0 90 CLR P-12 (SUTURE) ×1 IMPLANT
SYR 3ML LL SCALE MARK (SYRINGE) ×3 IMPLANT

## 2017-07-12 NOTE — OR Nursing (Signed)
Explanted left chest pacemaker battery- Zephyr XL DR [id number] DDDR S/N [id number].

## 2017-07-12 NOTE — Transfer of Care (Signed)
Immediate Anesthesia Transfer of Care Note  Patient: Melanie Franklin  Procedure(s) Performed: Procedure(s): PACEMAKER CHANGE OUT (Left)  Patient Location: PACU  Anesthesia Type:General  Level of Consciousness: sedated  Airway & Oxygen Therapy: Patient Spontanous Breathing and Patient connected to nasal cannula oxygen  Post-op Assessment: Report given to RN and Post -op Vital signs reviewed and stable  Post vital signs: Reviewed and stable  Last Vitals:  Vitals:   07/12/17 1118 07/12/17 1423  BP: (!) 176/55 137/67  Pulse: 87 73  Resp: 16 16  Temp: (!) 36.4 C (!) 36.3 C  SpO2: 97% 99%    Last Pain:  Vitals:   07/12/17 1423  TempSrc:   PainSc: 0-No pain         Complications: No apparent anesthesia complications

## 2017-07-12 NOTE — Anesthesia Post-op Follow-up Note (Signed)
Anesthesia QCDR form completed.        

## 2017-07-12 NOTE — OR Nursing (Signed)
Medtronic patient monitor in box given to family in postop (came from PACU with patient)

## 2017-07-12 NOTE — Discharge Instructions (Signed)

## 2017-07-12 NOTE — Anesthesia Postprocedure Evaluation (Signed)
Anesthesia Post Note  Patient: CRISTINA CENICEROS  Procedure(s) Performed: Procedure(s) (LRB): PACEMAKER CHANGE OUT (Left)  Patient location during evaluation: PACU Anesthesia Type: General Level of consciousness: awake and alert and oriented Pain management: pain level controlled Vital Signs Assessment: post-procedure vital signs reviewed and stable Respiratory status: spontaneous breathing, nonlabored ventilation and respiratory function stable Cardiovascular status: blood pressure returned to baseline and stable Postop Assessment: no signs of nausea or vomiting Anesthetic complications: no     Last Vitals:  Vitals:   07/12/17 1118 07/12/17 1423  BP: (!) 176/55 137/67  Pulse: 87 73  Resp: 16 16  Temp: (!) 36.4 C (!) 36.3 C  SpO2: 97% 99%    Last Pain:  Vitals:   07/12/17 1423  TempSrc:   PainSc: 0-No pain                 Trevyn Lumpkin

## 2017-07-12 NOTE — Anesthesia Preprocedure Evaluation (Addendum)
Anesthesia Evaluation  Patient identified by MRN, date of birth, ID band Patient awake    Reviewed: Allergy & Precautions, NPO status , Patient's Chart, lab work & pertinent test results  History of Anesthesia Complications Negative for: history of anesthetic complications  Airway Mallampati: III  TM Distance: >3 FB Neck ROM: Full    Dental no notable dental hx.    Pulmonary sleep apnea and Continuous Positive Airway Pressure Ventilation , neg COPD,    breath sounds clear to auscultation- rhonchi (-) wheezing      Cardiovascular hypertension, + CAD and + CABG (4v CABG 02/2009)  (-) Cardiac Stents + dysrhythmias (complete heart block) + pacemaker  Rhythm:Regular Rate:Normal - Systolic murmurs and - Diastolic murmurs NM myocardial perfusion 05/15/17: No evidence of ischemia  Echo 06/18/16: NORMAL LEFT VENTRICULAR SYSTOLIC FUNCTION WITH AN ESTIMATED EF = 50 % NORMAL RIGHT VENTRICULAR SYSTOLIC FUNCTION MODERATE TRICUSPID AND MITRAL VALVE INSUFFICIENCY NO VALVULAR STENOSIS MILD BIATRIAL ENLARGEMENT MILD RV ENLARGEMENT PACER WIRE NOTED   Neuro/Psych PSYCHIATRIC DISORDERS Depression    GI/Hepatic Neg liver ROS, GERD  ,  Endo/Other  negative endocrine ROSneg diabetes  Renal/GU negative Renal ROS     Musculoskeletal  (+) Arthritis ,   Abdominal (+) - obese,   Peds  Hematology negative hematology ROS (+)   Anesthesia Other Findings Past Medical History: No date: Coronary artery disease No date: Family history of adverse reaction to anesthesia     Comment:  "sister had a reaction to meds given during a heart cath              that almost killed her." No date: GERD (gastroesophageal reflux disease) No date: Hypertension No date: Presence of permanent cardiac pacemaker   Reproductive/Obstetrics                            Anesthesia Physical Anesthesia Plan  ASA: III  Anesthesia Plan: General    Post-op Pain Management:    Induction: Intravenous  PONV Risk Score and Plan: 2 and Ondansetron and Propofol infusion  Airway Management Planned: Natural Airway  Additional Equipment:   Intra-op Plan:   Post-operative Plan:   Informed Consent: I have reviewed the patients History and Physical, chart, labs and discussed the procedure including the risks, benefits and alternatives for the proposed anesthesia with the patient or authorized representative who has indicated his/her understanding and acceptance.   Dental advisory given  Plan Discussed with: CRNA and Anesthesiologist  Anesthesia Plan Comments:         Anesthesia Quick Evaluation

## 2017-07-12 NOTE — Anesthesia Procedure Notes (Signed)
Date/Time: 07/12/2017 1:24 PM Performed by: Darlyne Russian Pre-anesthesia Checklist: Patient identified, Emergency Drugs available, Suction available, Patient being monitored and Timeout performed Patient Re-evaluated:Patient Re-evaluated prior to induction Oxygen Delivery Method: Simple face mask Placement Confirmation: positive ETCO2

## 2017-07-12 NOTE — Op Note (Signed)
Preoperative diagnosis AV block, device at [facility] Postoperative diagnosis same same   Procedure: Pacemaker Generator replacement;   Following informed consent the patient was brought to the electrophysiology laboratory in place of the fluoroscopic table in the supine position after routine prep and drape lidocaine was infiltrated in the region of the previous incision and carried down to later the device pocket using sharp dissection and electrocautery. The pocket was opened the device was freed up and was explanted.  Interrogation of the previously implanted RV ventricular lead St. Jude 1888Tc demonstrated an R wave of not assessed (dependent), and impedance of 480 ohms, and a pacing threshold of 0.9 volts at 0.0.5 msec.  The leads were inspected. Repair was not needed. The leads were then attached to a MDT pulse generator, serial number AZure XT DR NRI [id number].      Atrial lead impedance was  437 ohms, and a pacing threshold of  0.5 volts at @ 0.35milliseconds; the Right ventricular lead impedance of 418 ohms, and a pacing threshold of  1.0 volts at 0.4 msec.   The pocket was irrigated with antibiotic containing saline solution hemostasis was assured and the leads and the device were placed in the pocket. The wound was then closed in 3 layers in normal fashion.  The patient tolerated the procedure without apparent complication.   EBL Minimal   Implanted hardware RA STJ 1888T [id number] 07/17/2010 RV STJ 1888T [id number] 07/17/2010 Pulse generator MDT Azure XT DR MRI [id number] H 07/12/2017  Parameters DDD 60/120 sensor 120 pacedav 180 sensed 150 Outputs A&V 3.5 @ 0.4

## 2017-07-12 NOTE — H&P (Signed)
Patient presents with  . Follow-up  end of battery  Date of Service: 06/18/2017 Date of Birth: 05-01-1929 PCP: Eulas Post, MD  History of Present Illness: Ms. Esquibel is a 83 y.o.female patient  Pacemaker Interrogation The patient has had a pacemaker interrogation which has shown that the pacemaker battery has reached end of life. We have had further discussion of battery change out with all of the risks and benefits. Otherwise the patient has not had any significant side effects or symptoms of the pacemaker wires. Coronary artery disease The patient has known coronary artery disease status post coronary bypass graft currently stable on ACE inhibitor, statin, asa and ca++blocker without apparent side effects of treatment and with risk factors including age, HTN and Hyperlipidemia without evidence of significant progressive anginal symptoms requiring further intervention at this time. Risk factor management is continuing to be reviewed at every visit. Essential hypertension The patient currently has a diagnosis of essential hypertension with no evidence of secondary causes of high blood pressure at this time. The patient has been on appropriate medication management without current evidence of significant side effects. This regimen is for risk reduction of cardiovascular disease and possible future complication. The blood pressure appears stable today. We have discussed treatment goals and current guidelines for hypertension therapy. The patient understands risks and benefits of medication management and risk factor modification. They agree to continuation of this current medical regimen.  Past Medical and Surgical History  Past Medical History Past Medical History:  Diagnosis Date  . Arthritis  . Blockage of coronary artery of heart (CMS-HCC)  . Coronary artery disease  . GERD (gastroesophageal reflux disease)  . Hyperlipidemia, unspecified  . Hypertension  . Sleep apnea   Past Surgical  History She has a past surgical history that includes Insert / replace / remove pacemaker; Coronary artery bypass graft; Cardiac catheterization; and Colonoscopy (12/11/1997, 07/15/2001, 04/29/2006, ).   Medications and Allergies  Current Medications  Current Outpatient Prescriptions on File Prior to Visit  Medication Sig Dispense Refill  . *calcium carbonate oral 2 tab by mouth daily (Patient taking differently: Take 1 tablet by mouth 2 (two) times daily. )  . *glucosamine sulfate/chondroitin sulfate a/antioxid multvit#5 oral 1 tab by mouth daily  . acetaminophen 650 mg Tab 1 tab by mouth every 6 hours as needed  . amLODIPine (NORVASC) 5 MG tablet 1 tab by mouth daily  . aspirin 81 MG EC tablet 1 tab by mouth daily  . magnesium oxide (MAG-OX) 400 mg tablet 2 tab by mouth daily  . multivitamin tablet Take by mouth.  Marland Kitchen omeprazole (PRILOSEC) 20 MG DR capsule 1 cap by mouth daily  . pravastatin (PRAVACHOL) 40 MG tablet 1 tab by mouth at bedtime  . quinapril-hydrochlorothiazide (ACCURETIC) 20-12.5 mg tablet Take 2 tablets by mouth once daily.    No current facility-administered medications on file prior to visit.   Allergies: Ciprofloxacin; Meloxicam; Amoxicillin; and Entex lq [phenylephrine-guaifenesin]  Social and Family History  Social History reports that she has never smoked. She has never used smokeless tobacco. She reports that she does not drink alcohol or use drugs.  Family History Family History  Problem Relation Age of Onset  . Stroke Mother  . Stroke Father  . Myocardial Infarction (Heart attack) Father  . Heart disease Sister  . Diabetes type II Sister  . Heart failure Sister   Review of Systems   Review of Systems  Positive for fatigue Negative for weight gain weight loss, weakness, vision  change, hearing loss, cough, congestion, PND, orthopnea, heartburn, nausea, diaphoresis, vomiting, diarrhea, bloody stool, melena, stomach pain, extremity pain, leg weakness, leg  cramping, leg blood clots, headache, blackouts, nosebleed, trouble swallowing, mouth pain, urinary frequency, urination at night, muscle weakness, skin lesions, skin rashes, tingling ,ulcers, numbness, anxiety, and/or depression Physical Examination   Vitals:BP 142/74  Pulse 83  Ht 160 cm (5\' 3" )  Wt 64.4 kg (142 lb)  SpO2 97%  BMI 25.15 kg/m  Ht:160 cm (5\' 3" ) Wt:64.4 kg (142 lb) PTW:[facility] surface area is 1.69 meters squared. Body mass index is 25.15 kg/m. Appearance: well appearing in no acute distress HEENT: Pupils equally reactive to light and accomodation, no xanthalasma  Neck: Supple, no apparent thyromegaly, masses, or lymphadenopathy  Lungs: normal respiratory effort; no crackles, no rhonchi, no wheezes Heart: Regular rate and rhythm. Normal S1 S2 No gallops, murmur, no rub, PMI is normal size and placement. carotid upstroke normal without bruit. Jugular venous pressure is normal Abdomen: soft, nontender, not distended with normal bowel sounds. No apparent hepatosplenomegally. Abdominal aorta is normal size without bruit Extremities: no edema, no ulcers, no clubbing, no cyanosis Peripheral Pulses: 2+ in upper extremities, 2+ femoral pulses bilaterally, 2+lower extremity  Musculoskeletal; Normal muscle tone without kyphosis Neurological: Oriented and Alert, Cranial nerves intact  Assessment   83 y.o. female with  Encounter Diagnoses  Name Primary?  . Complete heart block (CMS-HCC) Yes  . Benign essential hypertension  . Coronary artery disease involving coronary bypass graft of native heart without angina pectoris   Plan  -the patient will have a consultation and or surgical treatment for recent pacemaker interrogation showing the battery to be end of life. The patient will need a pacemaker battery change out. Patient understands all risks and benefits of battery change out. This includes the possibility of death, stroke, heart attack, infection, bleeding, blood clot, and or  side effects of medication management for anesthesia and agrees to proceed -Hypertension medication management listed above has been reviewed and discussed with the patient. We will continue current medical regimen at this time for reduction of risk of cardiovascular disease. The patient will report any new or future change of blood pressure for need in potential treatment changes. We have recommended home blood pressure monitoring if able. -No changes in current medical regimen and/or treatment for known coronary artery disease status post coronary artery bypass graft at this time. The patient will remain diligent in watching for any new cardiovascular symptoms requiring further intervention and/or medication side effects.

## 2017-07-14 ENCOUNTER — Encounter: Payer: Self-pay | Admitting: Cardiology

## 2017-07-16 ENCOUNTER — Encounter: Payer: Self-pay | Admitting: Cardiology

## 2017-08-28 DIAGNOSIS — Z23 Encounter for immunization: Secondary | ICD-10-CM | POA: Diagnosis not present

## 2017-09-03 DIAGNOSIS — D2261 Melanocytic nevi of right upper limb, including shoulder: Secondary | ICD-10-CM | POA: Diagnosis not present

## 2017-09-03 DIAGNOSIS — D225 Melanocytic nevi of trunk: Secondary | ICD-10-CM | POA: Diagnosis not present

## 2017-09-03 DIAGNOSIS — L820 Inflamed seborrheic keratosis: Secondary | ICD-10-CM | POA: Diagnosis not present

## 2017-09-03 DIAGNOSIS — L821 Other seborrheic keratosis: Secondary | ICD-10-CM | POA: Diagnosis not present

## 2017-09-03 DIAGNOSIS — L538 Other specified erythematous conditions: Secondary | ICD-10-CM | POA: Diagnosis not present

## 2017-09-03 DIAGNOSIS — L720 Epidermal cyst: Secondary | ICD-10-CM | POA: Diagnosis not present

## 2017-09-05 DIAGNOSIS — I25810 Atherosclerosis of coronary artery bypass graft(s) without angina pectoris: Secondary | ICD-10-CM | POA: Diagnosis not present

## 2017-09-05 DIAGNOSIS — I10 Essential (primary) hypertension: Secondary | ICD-10-CM | POA: Diagnosis not present

## 2017-09-05 DIAGNOSIS — R0602 Shortness of breath: Secondary | ICD-10-CM | POA: Diagnosis not present

## 2017-09-05 DIAGNOSIS — I071 Rheumatic tricuspid insufficiency: Secondary | ICD-10-CM | POA: Diagnosis not present

## 2017-09-05 DIAGNOSIS — I442 Atrioventricular block, complete: Secondary | ICD-10-CM | POA: Diagnosis not present

## 2017-09-05 DIAGNOSIS — G4733 Obstructive sleep apnea (adult) (pediatric): Secondary | ICD-10-CM | POA: Diagnosis not present

## 2017-09-05 DIAGNOSIS — I340 Nonrheumatic mitral (valve) insufficiency: Secondary | ICD-10-CM | POA: Diagnosis not present

## 2017-09-05 DIAGNOSIS — E782 Mixed hyperlipidemia: Secondary | ICD-10-CM | POA: Diagnosis not present

## 2017-09-05 DIAGNOSIS — Z950 Presence of cardiac pacemaker: Secondary | ICD-10-CM | POA: Diagnosis not present

## 2017-09-05 DIAGNOSIS — I872 Venous insufficiency (chronic) (peripheral): Secondary | ICD-10-CM | POA: Diagnosis not present

## 2017-09-23 ENCOUNTER — Ambulatory Visit (INDEPENDENT_AMBULATORY_CARE_PROVIDER_SITE_OTHER): Payer: Medicare Other | Admitting: Physician Assistant

## 2017-09-23 ENCOUNTER — Encounter: Payer: Self-pay | Admitting: Physician Assistant

## 2017-09-23 VITALS — BP 154/68 | HR 88 | Temp 98.3°F | Resp 16 | Wt 141.0 lb

## 2017-09-23 DIAGNOSIS — N3090 Cystitis, unspecified without hematuria: Secondary | ICD-10-CM | POA: Diagnosis not present

## 2017-09-23 DIAGNOSIS — R300 Dysuria: Secondary | ICD-10-CM | POA: Diagnosis not present

## 2017-09-23 MED ORDER — DOXYCYCLINE HYCLATE 100 MG PO TABS: 100.0000 mg | ORAL_TABLET | Freq: Two times a day (BID) | ORAL | 0 refills | Status: AC

## 2017-09-23 NOTE — Patient Instructions (Signed)

## 2017-09-23 NOTE — Progress Notes (Signed)
[facility]  Chief Complaint  Patient presents with  . Urinary Tract Infection    Started this morning    Subjective:    Patient ID: Melanie Franklin, female    DOB: November 30, 1928, 83 y.o.   MRN: [id number]   Urinary Tract Infection: Patient complains of dysuria and frequency She has had symptoms for a few hours. Patient denies fever, stomach ache and vaginal discharge. Patient does not have a history of recurrent UTI.  Patient does not have a history of pyelonephritis or other renal issues. Patient denies vaginal discharge and denies new sexual partners. The patient denies recent travel outside of the [place].  Flu shot at [employer] recently  Review of Systems  Constitutional: Positive for malaise/fatigue. Negative for chills, diaphoresis, fever and weight loss.  Gastrointestinal: Negative.   Genitourinary: Positive for dysuria, frequency and urgency. Negative for flank pain and hematuria.  Musculoskeletal: Positive for back pain.  Neurological: Negative for weakness.       Objective:   BP (!) 154/68 (BP Location: Left Arm, Patient Position: Sitting, Cuff Size: Normal)   Pulse 88   Temp 98.3 F (36.8 C) (Oral)   Resp 16   Wt 141 lb (64 kg)   BMI 24.98 kg/m   Patient Active Problem List   Diagnosis Date Noted  . Chest pain 06/06/2016  . Memory loss or impairment 07/11/2015  . Arthritis 05/18/2015  . Arthropathy of hand 05/18/2015  . CAD in native artery 05/18/2015  . Carpal tunnel syndrome 05/18/2015  . DDD (degenerative disc disease), cervical 05/18/2015  . Clinical depression 05/18/2015  . Gastro-esophageal reflux disease without esophagitis 05/18/2015  . Essential (primary) hypertension 05/18/2015  . Herpes zona 05/18/2015  . Adaptive colitis 05/18/2015  . Hypercholesteremia 05/18/2015  . 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 Fluttering heart 05/18/2015  . Restless leg 05/18/2015  . Apnea, sleep 05/18/2015  .  Umbilical hernia without obstruction and without gangrene 05/18/2015  . TI (tricuspid incompetence) 05/04/2015  . Obstructive apnea 09/30/2014  . MI (mitral incompetence) 09/30/2014  . Artificial cardiac pacemaker 01/27/2014  . Acquired complete AV block ([place]) 01/27/2014    Outpatient Encounter Medications as of 09/23/2017  Medication Sig  . acetaminophen (TYLENOL) 650 MG CR tablet Take 650 mg by mouth 2 (two) times daily. 1 tablet in the morning and 1 at bedtime  . amLODipine (NORVASC) 5 MG tablet TAKE 1 TABLET (5 MG TOTAL) BY MOUTH DAILY. (Patient taking differently: Take 5 mg by mouth daily at 12 noon. )  . aspirin 81 MG tablet Take 81 mg by mouth daily.   . magnesium oxide (MAG-OX) 400 MG tablet Take 400 mg by mouth daily.   . MULTIPLE VITAMINS PO Take 1 tablet by mouth daily.   . Multiple Vitamins-Minerals (ONE DAILY CALCIUM/IRON PO) Take 1 tablet by mouth daily.  Marland Kitchen omeprazole (PRILOSEC) 20 MG capsule TAKE 1 CAPSULE (20 MG TOTAL) BY MOUTH DAILY.  Marland Kitchen OVER THE COUNTER MEDICATION Apply 1 application topically as needed (muscle and joint  pain). Horse Liniment Topical Analgesic 4% Natural Menthol  . pravastatin (PRAVACHOL) 40 MG tablet TAKE 1 TABLET DAILY (Patient taking differently: TAKE 1 TABLET DAILY AT BEDTIME)  . quinapril-hydrochlorothiazide (ACCURETIC) 20-12.5 MG tablet TAKE 2 TABLETS BY MOUTH DAILY.  Marland Kitchen rOPINIRole (REQUIP) 1 MG tablet Take 1 tablet (1 mg total) by mouth at bedtime.  Marland Kitchen doxycycline (VIBRA-TABS) 100 MG tablet Take 1 tablet (100 mg total) 2 (two) times daily for 7 days  by mouth.   No facility-administered encounter medications on file as of 09/23/2017.     Allergies  Allergen Reactions  . Ciprofloxacin Other (See Comments)  . Meloxicam Other (See Comments)    Abdominal pain  . Tramadol Other (See Comments)    Abdominal pain   . Amoxicillin Swelling and Rash    Has patient had a PCN reaction causing immediate rash, facial/tongue/throat swelling, SOB or  lightheadedness with hypotension: Unknown Has patient had a PCN reaction causing severe rash involving mucus membranes or skin necrosis: Unknown Has patient had a PCN reaction that required hospitalization: Unknown Has patient had a PCN reaction occurring within the last 10 years: Unknown If all of the above answers are "NO", then may proceed with Cephalosporin use.   . Entex Lq  [Phenylephrine-Guaifenesin] Swelling and Rash       Physical Exam  Constitutional: She is oriented to person, place, and time. She appears well-developed and well-nourished. No distress.  Cardiovascular: Normal rate and regular rhythm.  Pulmonary/Chest: Effort normal and breath sounds normal.  Abdominal: Soft. Bowel sounds are normal. She exhibits no distension. There is tenderness in the suprapubic area. There is no rebound, no guarding and no CVA tenderness.  Neurological: She is alert and oriented to person, place, and time.  Skin: Skin is warm and dry. She is not diaphoretic.  Psychiatric: She has a normal mood and affect. Her behavior is normal.       Assessment & Plan:  1. Cystitis  - doxycycline (VIBRA-TABS) 100 MG tablet; Take 1 tablet (100 mg total) 2 (two) times daily for 7 days by mouth.  Dispense: 14 tablet; Refill: 0 - POCT urinalysis dipstick - Urine Culture  2. Dysuria  - POCT urinalysis dipstick - Urine Culture  Return if symptoms worsen or fail to improve.  The entirety of the information documented in the History of Present Illness, Review of Systems and Physical Exam were personally obtained by me. Portions of this information were initially documented by Ashley Royalty, CMA and reviewed by me for thoroughness and accuracy.

## 2017-09-24 LAB — POCT URINALYSIS DIPSTICK
Bilirubin, UA: NEGATIVE
Glucose, UA: NEGATIVE
Ketones, UA: NEGATIVE
Nitrite, UA: NEGATIVE
Protein, UA: NEGATIVE
Spec Grav, UA: 1.025 (ref 1.010–1.025)
Urobilinogen, UA: 0.2 E.U./dL
pH, UA: 7.5 (ref 5.0–8.0)

## 2017-09-26 ENCOUNTER — Telehealth: Payer: Self-pay

## 2017-09-26 DIAGNOSIS — N3090 Cystitis, unspecified without hematuria: Secondary | ICD-10-CM

## 2017-09-26 LAB — URINE CULTURE
MICRO NUMBER:: 81240703
SPECIMEN QUALITY:: ADEQUATE

## 2017-09-26 NOTE — Telephone Encounter (Signed)
Tried callling; number not in service.   Thanks,   -Mickel Baas

## 2017-09-26 NOTE — Telephone Encounter (Signed)
-----   Message from Trinna Post, [place] sent at 09/26/2017 11:11 AM EST ----- Proteus mirabilis came back on urine culture. Doxycycline not listed under sensitivities. How is patient feeling? Have her symptoms resolved?

## 2017-09-27 MED ORDER — SULFAMETHOXAZOLE-TRIMETHOPRIM 800-160 MG PO TABS: 1.0000 | ORAL_TABLET | Freq: Two times a day (BID) | ORAL | 0 refills | Status: AC

## 2017-09-27 NOTE — Telephone Encounter (Signed)
Pt advised. Patient will get new antibiotic. Patient will let us know if she is not better or if she gets [employer], RMA

## 2017-09-27 NOTE — Telephone Encounter (Signed)
Pt states she is feeling better but she is not well.  She states she still has pain when using the restroom. Pt states she still have medication    Confirmed with the patient the phone number on file is the correct number [phone]

## 2017-09-27 NOTE — Telephone Encounter (Signed)
I am going to send her in some bactrim, as that is listed on sensitivity. I would like her to discontinue the doxycycline and complete the course of Bactrim. If worsening, please be seen.

## 2017-10-19 IMAGING — CR DG SHOULDER 2+V*L*
3 series · 3 of 3 positions shown · non-contrast
Comparison: None

CLINICAL DATA: Fell this afternoon landing on LEFT shoulder, pain,
unable to move much, initial encounter

EXAM:
LEFT SHOULDER - 2+ VIEW

[shoulder grashey]
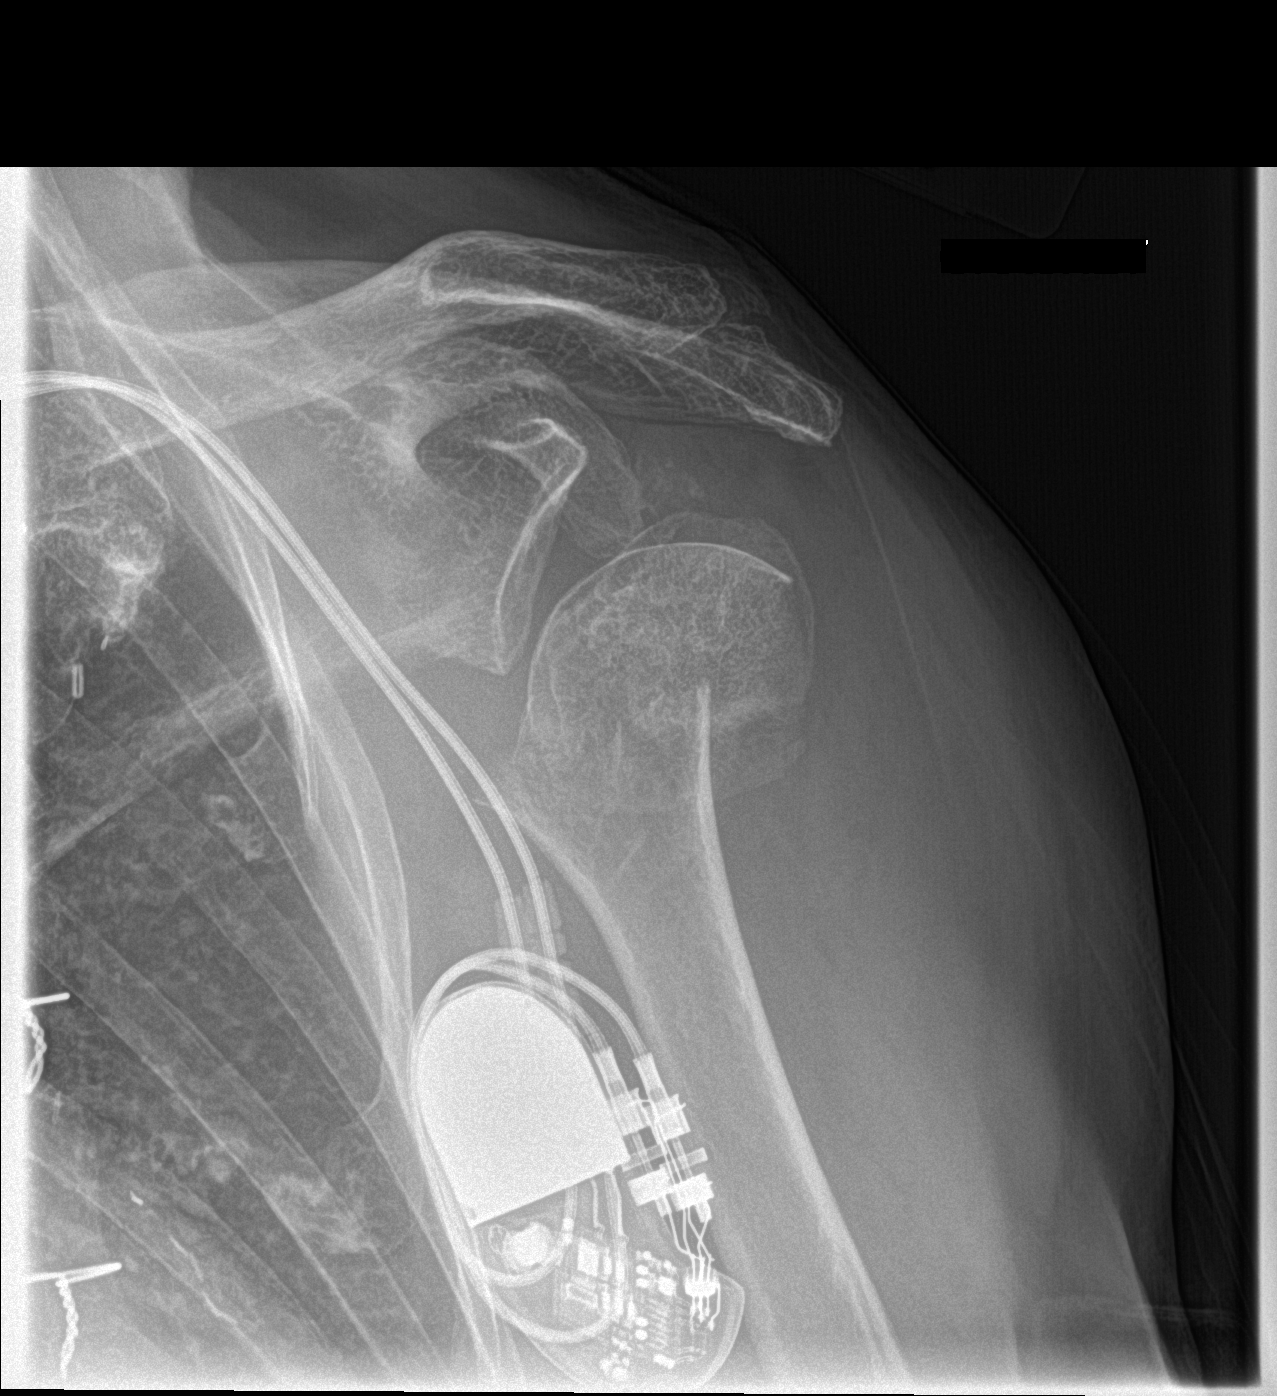

[shoulder y view]
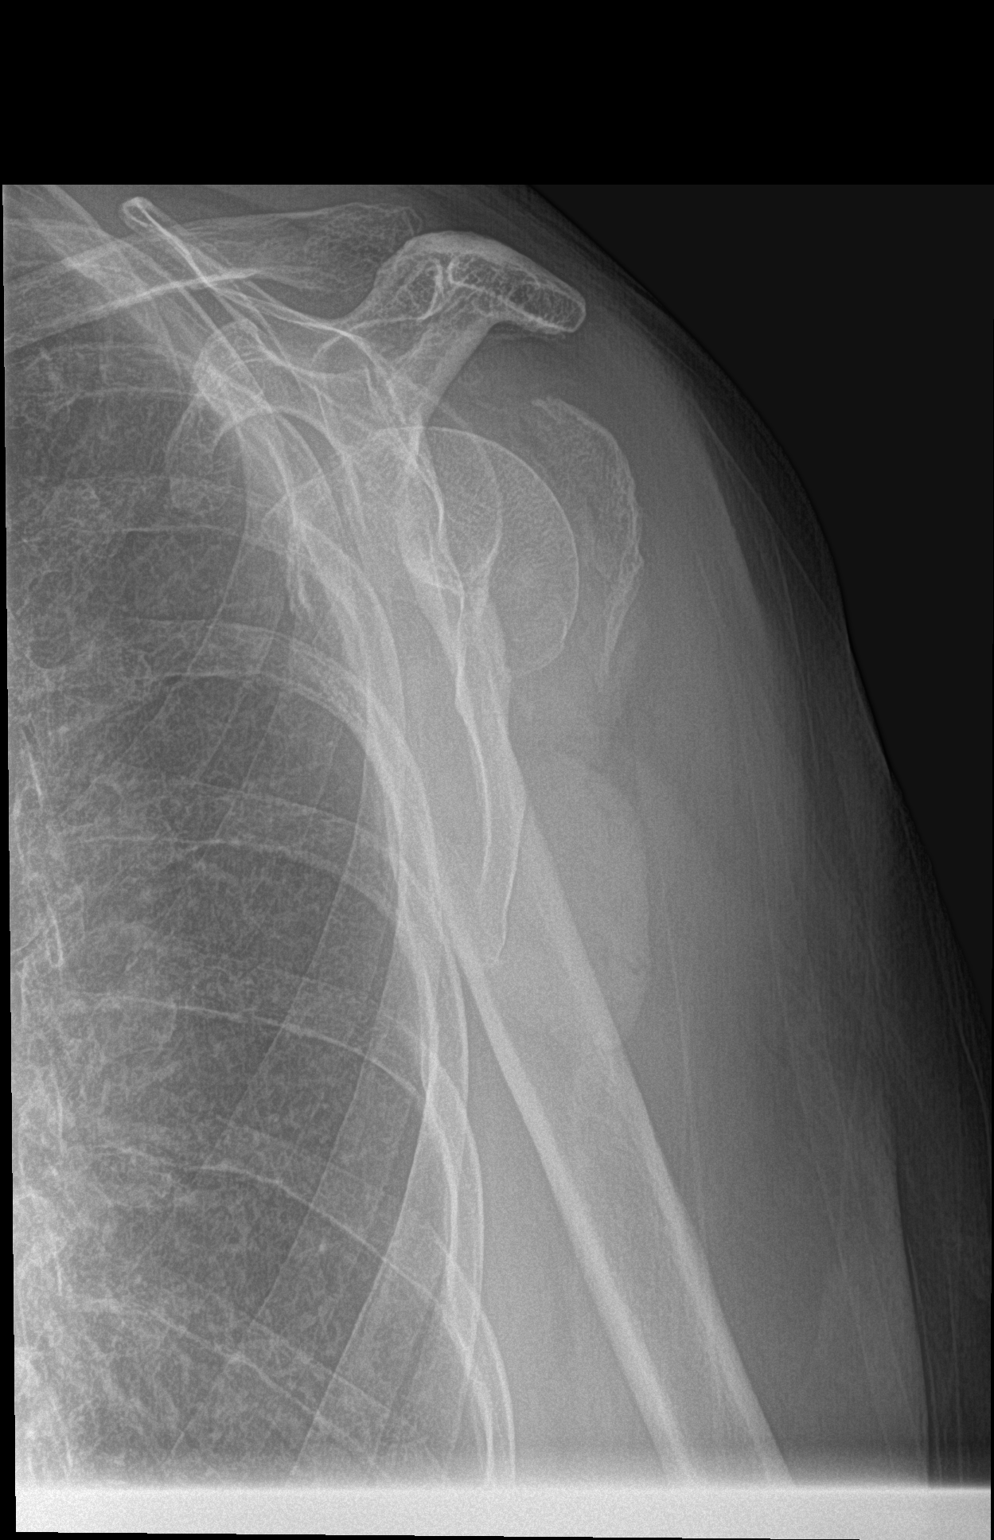

[shoulder axillary]
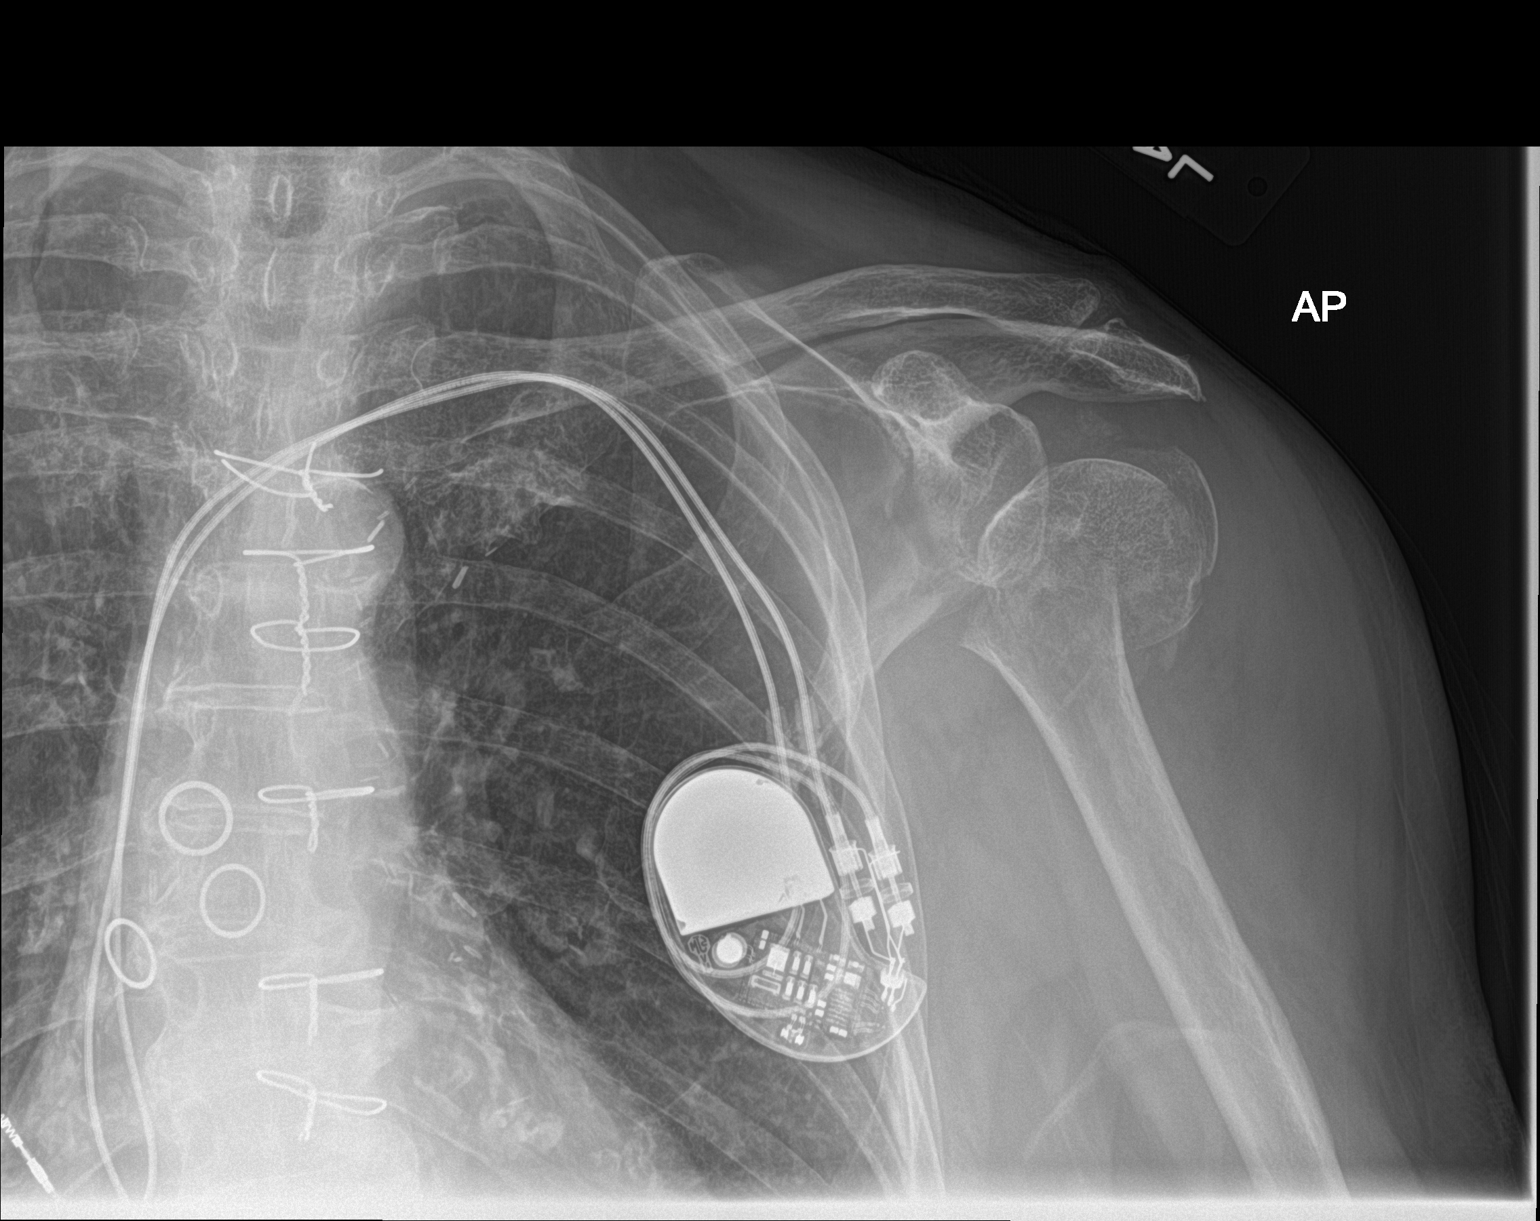

[3 of 3 positions shown; findings below may reference images not displayed]

FINDINGS: Osseous demineralization.

Comminuted fracture involving the head and surgical neck of the LEFT
humerus.

Fracture is mildly displaced, including both the fracture at the
neck and a large head fragment.

Slight widening of the glenohumeral joint without frank dislocation.

AC joint alignment normal.

Visualized LEFT ribs intact.

Pacemaker noted.
IMPRESSION: Comminuted displaced proximal LEFT humeral fracture.

## 2017-10-23 ENCOUNTER — Ambulatory Visit (INDEPENDENT_AMBULATORY_CARE_PROVIDER_SITE_OTHER): Payer: Medicare Other | Admitting: Family Medicine

## 2017-10-23 ENCOUNTER — Telehealth: Payer: Self-pay | Admitting: Family Medicine

## 2017-10-23 VITALS — BP 136/54 | HR 78 | Temp 98.2°F | Resp 16 | Wt 139.4 lb

## 2017-10-23 DIAGNOSIS — I2510 Atherosclerotic heart disease of native coronary artery without angina pectoris: Secondary | ICD-10-CM

## 2017-10-23 DIAGNOSIS — N390 Urinary tract infection, site not specified: Secondary | ICD-10-CM

## 2017-10-23 DIAGNOSIS — R413 Other amnesia: Secondary | ICD-10-CM

## 2017-10-23 DIAGNOSIS — I10 Essential (primary) hypertension: Secondary | ICD-10-CM | POA: Diagnosis not present

## 2017-10-23 DIAGNOSIS — Z950 Presence of cardiac pacemaker: Secondary | ICD-10-CM

## 2017-10-23 DIAGNOSIS — I442 Atrioventricular block, complete: Secondary | ICD-10-CM | POA: Diagnosis not present

## 2017-10-23 DIAGNOSIS — R109 Unspecified abdominal pain: Secondary | ICD-10-CM | POA: Diagnosis not present

## 2017-10-23 LAB — POCT URINALYSIS DIPSTICK
Bilirubin, UA: NEGATIVE
Glucose, UA: NEGATIVE
Ketones, UA: NEGATIVE
LEUKOCYTES UA: NEGATIVE
NITRITE UA: NEGATIVE
PH UA: 7 (ref 5.0–8.0)
Protein, UA: NEGATIVE
RBC UA: NEGATIVE
Spec Grav, UA: 1.015 (ref 1.010–1.025)
UROBILINOGEN UA: 0.2 U/dL

## 2017-10-23 NOTE — Progress Notes (Signed)
Melanie Franklin  MRN: [id number] DOB: 1929-08-24  Subjective:  HPI  Patient is here for 6 months follow up. Last routine visit was on 04/17/17. HTN: on last visit increased Quinapril-HCTZ to 2 tablets daily due to getting elevated readings at home. Patient is not checking her b/p on regular basis. Not sure of the readings. She is not having chest pain but she is still having some discomfort where her pacemaker was placed, this comes and goes. Pt c/o recurrent nonexertional thoracic back/flank pain. Does not seem to be food related either. BP Readings from Last 3 Encounters:  10/23/17 (!) 136/54  09/23/17 (!) 154/68  07/12/17 (!) 148/67   GERD: symptoms are controlled with taking Omeprazole daily. Hyperlipidemia: last lab work was done in May 2018. Patient is taking Pravastatin daily. Lab Results  Component Value Date   CHOL 207 (H) 03/21/2017   HDL 77 03/21/2017   LDLCALC 105 (H) 03/21/2017   TRIG 123 03/21/2017   CHOLHDL 2.7 03/21/2016   Patient saw Anselm Jungling on 09/23/17 for cysttis. She was treated with Doxy at first and then after urine culture was placed on Bactrim. Patient states burning and pain and discomfort resolved but she is still having urinary frequency and urgency at night time and during the day several times. Patient Active Problem List   Diagnosis Date Noted  . Chest pain 06/06/2016  . Memory loss or impairment 07/11/2015  . Arthritis 05/18/2015  . Arthropathy of hand 05/18/2015  . CAD in native artery 05/18/2015  . Carpal tunnel syndrome 05/18/2015  . DDD (degenerative disc disease), cervical 05/18/2015  . Clinical depression 05/18/2015  . Gastro-esophageal reflux disease without esophagitis 05/18/2015  . Essential (primary) hypertension 05/18/2015  . Herpes zona 05/18/2015  . Adaptive colitis 05/18/2015  . Hypercholesteremia 05/18/2015  . 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 Fluttering heart 05/18/2015  . Restless leg 05/18/2015  . Apnea, sleep  05/18/2015  . Umbilical hernia without obstruction and without gangrene 05/18/2015  . TI (tricuspid incompetence) 05/04/2015  . Obstructive apnea 09/30/2014  . MI (mitral incompetence) 09/30/2014  . Artificial cardiac pacemaker 01/27/2014  . Acquired complete AV block ([place]) 01/27/2014    Past Medical History:  Diagnosis Date  . Coronary artery disease   . Family history of adverse reaction to anesthesia    "sister had a reaction to meds given during a heart cath that almost killed her."  . GERD (gastroesophageal reflux disease)   . Hypertension   . Presence of permanent cardiac pacemaker     Social History   Socioeconomic History  . Marital status: Married    Spouse name: Not on file  . Number of children: Not on file  . Years of education: Not on file  . Highest education level: Not on file  Social Needs  . Financial resource strain: Not on file  . Food insecurity - worry: Not on file  . Food insecurity - inability: Not on file  . Transportation needs - medical: Not on file  . Transportation needs - non-medical: Not on file  Occupational History  . Not on file  Tobacco Use  . Smoking status: Never Smoker  . Smokeless tobacco: Never Used  Substance and Sexual Activity  . Alcohol use: No  . Drug use: No  . Sexual activity: Not on file  Other Topics Concern  . Not on file  Social History Narrative  . Not on file    Outpatient Encounter Medications as of 10/23/2017  Medication Sig  .  acetaminophen (TYLENOL) 650 MG CR tablet Take 650 mg by mouth 2 (two) times daily. 1 tablet in the morning and 1 at bedtime  . amLODipine (NORVASC) 5 MG tablet TAKE 1 TABLET (5 MG TOTAL) BY MOUTH DAILY. (Patient taking differently: Take 5 mg by mouth daily at 12 noon. )  . aspirin 81 MG tablet Take 81 mg by mouth daily.   . magnesium oxide (MAG-OX) 400 MG tablet Take 400 mg by mouth daily.   . MULTIPLE VITAMINS PO Take 1 tablet by mouth daily.   . Multiple Vitamins-Minerals (ONE DAILY  CALCIUM/IRON PO) Take 1 tablet by mouth daily.  Marland Kitchen omeprazole (PRILOSEC) 20 MG capsule TAKE 1 CAPSULE (20 MG TOTAL) BY MOUTH DAILY.  Marland Kitchen OVER THE COUNTER MEDICATION Apply 1 application topically as needed (muscle and joint  pain). Horse Liniment Topical Analgesic 4% Natural Menthol  . pravastatin (PRAVACHOL) 40 MG tablet TAKE 1 TABLET DAILY (Patient taking differently: TAKE 1 TABLET DAILY AT BEDTIME)  . quinapril-hydrochlorothiazide (ACCURETIC) 20-12.5 MG tablet TAKE 2 TABLETS BY MOUTH DAILY.  Marland Kitchen rOPINIRole (REQUIP) 1 MG tablet Take 1 tablet (1 mg total) by mouth at bedtime.   No facility-administered encounter medications on file as of 10/23/2017.     Allergies  Allergen Reactions  . Ciprofloxacin Other (See Comments)  . Meloxicam Other (See Comments)    Abdominal pain  . Tramadol Other (See Comments)    Abdominal pain   . Amoxicillin Swelling and Rash    Has patient had a PCN reaction causing immediate rash, facial/tongue/throat swelling, SOB or lightheadedness with hypotension: Unknown Has patient had a PCN reaction causing severe rash involving mucus membranes or skin necrosis: Unknown Has patient had a PCN reaction that required hospitalization: Unknown Has patient had a PCN reaction occurring within the last 10 years: Unknown If all of the above answers are "NO", then may proceed with Cephalosporin use.   . Entex Lq  [Phenylephrine-Guaifenesin] Swelling and Rash    Review of Systems  Constitutional: Negative.   Eyes: Negative.   Respiratory: Negative.   Cardiovascular: Negative.   Gastrointestinal: Negative.        Controlled.  Genitourinary: Positive for frequency and urgency. Negative for dysuria, flank pain and hematuria.  Musculoskeletal: Positive for back pain and joint pain.  Skin: Negative.   Neurological: Negative.   Psychiatric/Behavioral: Negative.     Objective:  BP (!) 136/54   Pulse 78   Temp 98.2 F (36.8 C)   Resp 16   Wt 139 lb 6.4 oz (63.2 kg)   BMI  24.69 kg/m   Physical Exam  Constitutional: She is oriented to person, place, and time and well-developed, well-nourished, and in no distress.  HENT:  Head: Normocephalic and atraumatic.  Right Ear: External ear normal.  Left Ear: External ear normal.  Eyes: Conjunctivae are normal. No scleral icterus.  Neck: Neck supple. No thyromegaly present.  Cardiovascular: Normal rate, regular rhythm and normal heart sounds.  Pulmonary/Chest: Effort normal and breath sounds normal.  Abdominal: Soft. She exhibits no mass. There is no tenderness. There is no rebound and no guarding.  Lymphadenopathy:    She has no cervical adenopathy.  Neurological: She is alert and oriented to person, place, and time. Gait normal. GCS score is 15.  Skin: Skin is warm and dry.  Psychiatric: Mood, memory, affect and judgment normal.    Assessment and Plan :  HTN GERD CAD All risk factors treated. AV Block/pacemaker Thoracic back Pain/Flank pain Very nonspecific--obtain General abdomenal  US--Vaginal not needed. Recurrent UTI  I have done the exam and reviewed the chart and it is accurate to the best of my knowledge. Development worker, community has been used and  any errors in dictation or transcription are unintentional. Miguel Aschoff M.D. [facility]

## 2017-10-23 NOTE — Telephone Encounter (Signed)
Pt stated she was returning Sarah's call. Please advise. Thanks TNP °

## 2017-10-25 ENCOUNTER — Telehealth: Payer: Self-pay | Admitting: Family Medicine

## 2017-10-25 DIAGNOSIS — N390 Urinary tract infection, site not specified: Secondary | ICD-10-CM

## 2017-10-25 DIAGNOSIS — R109 Unspecified abdominal pain: Secondary | ICD-10-CM

## 2017-10-25 NOTE — Telephone Encounter (Signed)
Order placed-Melanie Franklin V Melanie Franklin, RMA  

## 2017-10-25 NOTE — Telephone Encounter (Signed)
Manuela Schwartz from scheduling called stating she was told that abd ultrasound needs to be changed to renal ultrasound [id number] with diagnosis that was given.Pt's appointment is on Monday 10/28/17

## 2017-10-28 ENCOUNTER — Ambulatory Visit: Payer: Medicare Other

## 2017-10-31 ENCOUNTER — Ambulatory Visit: Payer: Medicare Other

## 2017-11-07 ENCOUNTER — Ambulatory Visit
Admission: RE | Admit: 2017-11-07 | Discharge: 2017-11-07 | Disposition: A | Discharge status: Home / Self Care | Payer: Medicare Other | Source: Ambulatory Visit | Attending: Family Medicine | Admitting: Family Medicine

## 2017-11-07 DIAGNOSIS — N390 Urinary tract infection, site not specified: Secondary | ICD-10-CM | POA: Insufficient documentation

## 2017-11-07 DIAGNOSIS — R109 Unspecified abdominal pain: Secondary | ICD-10-CM | POA: Insufficient documentation

## 2017-11-11 ENCOUNTER — Telehealth: Payer: Self-pay

## 2017-11-11 NOTE — Telephone Encounter (Signed)
Patient was advised that US renal result was ok. Patient still has pain not daily, and pain develops in different areas when it comes on. Patient takes Tylenol twice daily for the pain and when the pain gets severe she states she lays down until it goes away. Patient has been seen for this on 11/5 and 12/5. UA and urine cultures have been done. Please advise on next step. She does not have a follow up until June 2019.-Shiryl Ruddy Estell Harpin, [place]

## 2017-11-13 NOTE — Telephone Encounter (Signed)
lmtcb-Zakaria Sedor V Arisbel Maione, RMA  

## 2017-11-13 NOTE — Telephone Encounter (Signed)
appt in January.

## 2017-11-20 NOTE — Telephone Encounter (Signed)
Spoke with patient. Patient states that pain actually gotten better over the holidays, had episode once. Patient will wait and see how she does and if the pain gets worse or any new symptoms develop she will make an appointment sooner then Maureen Ralphs, RMA

## 2018-01-25 ENCOUNTER — Other Ambulatory Visit: Payer: Self-pay | Admitting: Family Medicine

## 2018-01-25 DIAGNOSIS — K219 Gastro-esophageal reflux disease without esophagitis: Secondary | ICD-10-CM

## 2018-02-26 DIAGNOSIS — Z961 Presence of intraocular lens: Secondary | ICD-10-CM | POA: Diagnosis not present

## 2018-03-06 DIAGNOSIS — I10 Essential (primary) hypertension: Secondary | ICD-10-CM | POA: Diagnosis not present

## 2018-03-06 DIAGNOSIS — G4733 Obstructive sleep apnea (adult) (pediatric): Secondary | ICD-10-CM | POA: Diagnosis not present

## 2018-03-06 DIAGNOSIS — I071 Rheumatic tricuspid insufficiency: Secondary | ICD-10-CM | POA: Diagnosis not present

## 2018-03-06 DIAGNOSIS — I442 Atrioventricular block, complete: Secondary | ICD-10-CM | POA: Diagnosis not present

## 2018-03-06 DIAGNOSIS — I25810 Atherosclerosis of coronary artery bypass graft(s) without angina pectoris: Secondary | ICD-10-CM | POA: Diagnosis not present

## 2018-03-26 ENCOUNTER — Telehealth: Payer: Self-pay | Admitting: Family Medicine

## 2018-03-26 NOTE — Telephone Encounter (Signed)
Pt called wanting to make sure she could be seen for her wellness visit and physical on the same day.She is afraid her insurance will not cover.

## 2018-03-26 NOTE — Telephone Encounter (Signed)
Patient advised that we can see her the same day, we just can't have a CPE, we will do an OV and address all her chronic issues.

## 2018-04-21 ENCOUNTER — Ambulatory Visit: Payer: Self-pay | Admitting: Family Medicine

## 2018-04-22 ENCOUNTER — Ambulatory Visit (INDEPENDENT_AMBULATORY_CARE_PROVIDER_SITE_OTHER): Payer: Medicare Other | Admitting: Family Medicine

## 2018-04-22 ENCOUNTER — Ambulatory Visit (INDEPENDENT_AMBULATORY_CARE_PROVIDER_SITE_OTHER): Payer: Medicare Other

## 2018-04-22 VITALS — BP 144/62 | HR 83 | Temp 98.3°F | Ht 62.0 in | Wt 140.4 lb

## 2018-04-22 VITALS — BP 144/62 | HR 82 | Temp 98.3°F | Resp 16 | Ht 62.0 in | Wt 140.0 lb

## 2018-04-22 DIAGNOSIS — E7800 Pure hypercholesterolemia, unspecified: Secondary | ICD-10-CM

## 2018-04-22 DIAGNOSIS — I10 Essential (primary) hypertension: Secondary | ICD-10-CM

## 2018-04-22 DIAGNOSIS — G5601 Carpal tunnel syndrome, right upper limb: Secondary | ICD-10-CM

## 2018-04-22 DIAGNOSIS — Z0000 Encounter for general adult medical examination without abnormal findings: Secondary | ICD-10-CM

## 2018-04-22 DIAGNOSIS — R413 Other amnesia: Secondary | ICD-10-CM

## 2018-04-22 DIAGNOSIS — I2510 Atherosclerotic heart disease of native coronary artery without angina pectoris: Secondary | ICD-10-CM

## 2018-04-22 MED ORDER — HYDROCHLOROTHIAZIDE 25 MG PO TABS: 25.0000 mg | ORAL_TABLET | Freq: Every day | ORAL | 12 refills | Status: DC

## 2018-04-22 NOTE — Patient Instructions (Addendum)
Melanie Franklin , Thank you for taking time to come for your Medicare Wellness Visit. I appreciate your ongoing commitment to your health goals. Please review the following plan we discussed and let me know if I can assist you in the future.   Screening recommendations/referrals: Colonoscopy: Up to date Mammogram: Up to date Bone Density: Up to date Recommended yearly ophthalmology/optometry visit for glaucoma screening and checkup Recommended yearly dental visit for hygiene and checkup  Vaccinations: Influenza vaccine: Up to date Pneumococcal vaccine: Up to date Tdap vaccine: Pt declines today.  Shingles vaccine: Pt declines today.     Advanced directives: Please bring a copy of your POA (Power of Attorney) and/or Living Will to your next appointment.   Conditions/risks identified: Recommend increasing water intake to 6 glasses a day.   Next appointment: 10:40 AM today with Dr Rosanna Randy.    Preventive Care 19 Years and Older, Female Preventive care refers to lifestyle choices and visits with your health care provider that can promote health and wellness. What does preventive care include?  A yearly physical exam. This is also called an annual well check.  Dental exams once or twice a year.  Routine eye exams. Ask your health care provider how often you should have your eyes checked.  Personal lifestyle choices, including:  Daily care of your teeth and gums.  Regular physical activity.  Eating a healthy diet.  Avoiding tobacco and drug use.  Limiting alcohol use.  Practicing safe sex.  Taking low-dose aspirin every day.  Taking vitamin and mineral supplements as recommended by your health care provider. What happens during an annual well check? The services and screenings done by your health care provider during your annual well check will depend on your age, overall health, lifestyle risk factors, and family history of disease. Counseling  Your health care provider may  ask you questions about your:  Alcohol use.  Tobacco use.  Drug use.  Emotional well-being.  Home and relationship well-being.  Sexual activity.  Eating habits.  History of falls.  Memory and ability to understand (cognition).  Work and work Statistician.  Reproductive health. Screening  You may have the following tests or measurements:  Height, weight, and BMI.  Blood pressure.  Lipid and cholesterol levels. These may be checked every 5 years, or more frequently if you are over 28 years old.  Skin check.  Lung cancer screening. You may have this screening every year starting at age 67 if you have a 30-pack-year history of smoking and currently smoke or have quit within the past 15 years.  Fecal occult blood test (FOBT) of the stool. You may have this test every year starting at age 39.  Flexible sigmoidoscopy or colonoscopy. You may have a sigmoidoscopy every 5 years or a colonoscopy every 10 years starting at age 19.  Hepatitis C blood test.  Hepatitis B blood test.  Sexually transmitted disease (STD) testing.  Diabetes screening. This is done by checking your blood sugar (glucose) after you have not eaten for a while (fasting). You may have this done every 1-3 years.  Bone density scan. This is done to screen for osteoporosis. You may have this done starting at age 69.  Mammogram. This may be done every 1-2 years. Talk to your health care provider about how often you should have regular mammograms. Talk with your health care provider about your test results, treatment options, and if necessary, the need for more tests. Vaccines  Your health care provider may  recommend certain vaccines, such as:  Influenza vaccine. This is recommended every year.  Tetanus, diphtheria, and acellular pertussis (Tdap, Td) vaccine. You may need a Td booster every 10 years.  Zoster vaccine. You may need this after age 74.  Pneumococcal 13-valent conjugate (PCV13) vaccine. One  dose is recommended after age 43.  Pneumococcal polysaccharide (PPSV23) vaccine. One dose is recommended after age 39. Talk to your health care provider about which screenings and vaccines you need and how often you need them. This information is not intended to replace advice given to you by your health care provider. Make sure you discuss any questions you have with your health care provider. Document Released: 12/02/2015 Document Revised: 07/25/2016 Document Reviewed: 09/06/2015 Elsevier Interactive Patient Education  2017 [place] Prevention in the Home Falls can cause injuries. They can happen to people of all ages. There are many things you can do to make your home safe and to help prevent falls. What can I do on the outside of my home?  Regularly fix the edges of walkways and driveways and fix any cracks.  Remove anything that might make you trip as you walk through a door, such as a raised step or threshold.  Trim any bushes or trees on the path to your home.  Use bright outdoor lighting.  Clear any walking paths of anything that might make someone trip, such as rocks or tools.  Regularly check to see if handrails are loose or broken. Make sure that both sides of any steps have handrails.  Any raised decks and porches should have guardrails on the edges.  Have any leaves, snow, or ice cleared regularly.  Use sand or salt on walking paths during winter.  Clean up any spills in your garage right away. This includes oil or grease spills. What can I do in the bathroom?  Use night lights.  Install grab bars by the toilet and in the tub and shower. Do not use towel bars as grab bars.  Use non-skid mats or decals in the tub or shower.  If you need to sit down in the shower, use a plastic, non-slip stool.  Keep the floor dry. Clean up any water that spills on the floor as soon as it happens.  Remove soap buildup in the tub or shower regularly.  Attach bath  mats securely with double-sided non-slip rug tape.  Do not have throw rugs and other things on the floor that can make you trip. What can I do in the bedroom?  Use night lights.  Make sure that you have a light by your bed that is easy to reach.  Do not use any sheets or blankets that are too big for your bed. They should not hang down onto the floor.  Have a firm chair that has side arms. You can use this for support while you get dressed.  Do not have throw rugs and other things on the floor that can make you trip. What can I do in the kitchen?  Clean up any spills right away.  Avoid walking on wet floors.  Keep items that you use a lot in easy-to-reach places.  If you need to reach something above you, use a strong step stool that has a grab bar.  Keep electrical cords out of the way.  Do not use floor polish or wax that makes floors slippery. If you must use wax, use non-skid floor wax.  Do not have throw rugs and  other things on the floor that can make you trip. What can I do with my stairs?  Do not leave any items on the stairs.  Make sure that there are handrails on both sides of the stairs and use them. Fix handrails that are broken or loose. Make sure that handrails are as long as the stairways.  Check any carpeting to make sure that it is firmly attached to the stairs. Fix any carpet that is loose or worn.  Avoid having throw rugs at the top or bottom of the stairs. If you do have throw rugs, attach them to the floor with carpet tape.  Make sure that you have a light switch at the top of the stairs and the bottom of the stairs. If you do not have them, ask someone to add them for you. What else can I do to help prevent falls?  Wear shoes that:  Do not have high heels.  Have rubber bottoms.  Are comfortable and fit you well.  Are closed at the toe. Do not wear sandals.  If you use a stepladder:  Make sure that it is fully opened. Do not climb a closed  stepladder.  Make sure that both sides of the stepladder are locked into place.  Ask someone to hold it for you, if possible.  Clearly mark and make sure that you can see:  Any grab bars or handrails.  First and last steps.  Where the edge of each step is.  Use tools that help you move around (mobility aids) if they are needed. These include:  Canes.  Walkers.  Scooters.  Crutches.  Turn on the lights when you go into a dark area. Replace any light bulbs as soon as they burn out.  Set up your furniture so you have a clear path. Avoid moving your furniture around.  If any of your floors are uneven, fix them.  If there are any pets around you, be aware of where they are.  Review your medicines with your doctor. Some medicines can make you feel dizzy. This can increase your chance of falling. Ask your doctor what other things that you can do to help prevent falls. This information is not intended to replace advice given to you by your health care provider. Make sure you discuss any questions you have with your health care provider. Document Released: 09/01/2009 Document Revised: 04/12/2016 Document Reviewed: 12/10/2014 Elsevier Interactive Patient Education  2017 [employer].

## 2018-04-22 NOTE — Progress Notes (Signed)
Subjective:   Melanie Franklin is a 84 y.o. female who presents for Medicare Annual (Subsequent) preventive examination.  Review of Systems:  N/A  Cardiac Risk Factors include: advanced age (>47men, >51 women);dyslipidemia;hypertension      Objective:     Vitals: BP (!) 144/62 (BP Location: Right Arm)   Pulse 83   Temp 98.3 F (36.8 C) (Oral)   Ht 5\' 2"  (1.575 m)   Wt 140 lb 6.4 oz (63.7 kg)   BMI 25.68 kg/m   Body mass index is 25.68 kg/m.  Advanced Directives 04/22/2018 07/12/2017 06/28/2017 03/21/2017 12/10/2016 12/10/2016 09/21/2015  Does Patient Have a Medical Advance Directive? Yes Yes Yes Yes Yes Yes No  Type of Paramedic of [place];Living will [place];Living will [place];Living will [place] -  Does patient want to make changes to medical advance directive? - No - Patient declined No - Patient declined Yes (ED - Information included in AVS) - - -  Copy of Healthcare Power of Attorney in Chart? No - copy requested No - copy requested No - copy requested Yes - No - copy requested -  Would patient like information on creating a medical advance directive? - - - - - - No - patient declined information    Tobacco Social History   Tobacco Use  Smoking Status Never Smoker  Smokeless Tobacco Never Used     Counseling given: Not Answered   Clinical Intake:  Pre-visit preparation completed: Yes  Pain : No/denies pain Pain Score: 0-No pain     Nutritional Status: BMI 25 -29 Overweight Nutritional Risks: None Diabetes: No  How often do you need to have someone help you when you read instructions, pamphlets, or other written materials from your doctor or pharmacy?: 1 - Never  Interpreter Needed?: No  Information entered by :: [facility], LPN  Past Medical History:  Diagnosis Date  . Coronary artery disease   . Family history of adverse  reaction to anesthesia    "sister had a reaction to meds given during a heart cath that almost killed her."  . GERD (gastroesophageal reflux disease)   . Hypertension   . Presence of permanent cardiac pacemaker    Past Surgical History:  Procedure Laterality Date  . CARDIAC CATHETERIZATION    . CATARACT EXTRACTION Bilateral   . CORONARY ARTERY BYPASS GRAFT  03/09/2009   x [facility]  . ESOPHAGEAL DILATION    . KNEE ARTHROSCOPY Left   . LASIK    . PACEMAKER INSERTION    . PACEMAKER INSERTION Left 07/12/2017   Procedure: PACEMAKER CHANGE OUT;  Surgeon: Marzetta Board, MD;  Location: [facility] ORS;  Service: Cardiovascular;  Laterality: Left;  . TONSILLECTOMY     Family History  Problem Relation Age of Onset  . Hypertension Mother   . Colon cancer Mother   . Cerebrovascular Accident Mother   . Ulcers Mother   . Other Mother        nervous breakdown  . Stroke Father   . Congestive Heart Failure Sister   . Kidney Stones Sister   . Cancer Sister        oral  . Hypertension Brother   . Hypertension Sister   . Diabetes Sister   . CAD Sister   . Other Sister   . Breast cancer Neg Hx    Social History   Socioeconomic History  .  Marital status: Widowed    Spouse name: Not on file  . Number of children: 2  . Years of education: Not on file  . Highest education level: 12th grade  Occupational History  . Not on file  Social Needs  . Financial resource strain: Not hard at all  . Food insecurity:    Worry: Never true    Inability: Never true  . Transportation needs:    Medical: No    Non-medical: No  Tobacco Use  . Smoking status: Never Smoker  . Smokeless tobacco: Never Used  Substance and Sexual Activity  . Alcohol use: No  . Drug use: No  . Sexual activity: Not on file  Lifestyle  . Physical activity:    Days per week: Not on file    Minutes per session: Not on file  . Stress: Not at all  Relationships  . Social connections:    Talks on  phone: Not on file    Gets together: Not on file    Attends religious service: Not on file    Active member of club or organization: Not on file    Attends meetings of clubs or organizations: Not on file    Relationship status: Not on file  Other Topics Concern  . Not on file  Social History Narrative  . Not on file    Outpatient Encounter Medications as of 04/22/2018  Medication Sig  . acetaminophen (TYLENOL) 650 MG CR tablet Take 650 mg by mouth 2 (two) times daily. 1 tablet in the morning and 1 at bedtime  . amLODipine (NORVASC) 5 MG tablet TAKE 1 TABLET (5 MG TOTAL) BY MOUTH DAILY.  Marland Kitchen aspirin 81 MG tablet Take 81 mg by mouth daily.   . magnesium oxide (MAG-OX) 400 MG tablet Take 400 mg by mouth daily.   . MULTIPLE VITAMINS PO Take 1 tablet by mouth daily.   . Multiple Vitamins-Minerals (ONE DAILY CALCIUM/IRON PO) Take 1 tablet by mouth daily.  Marland Kitchen omeprazole (PRILOSEC) 20 MG capsule TAKE 1 CAPSULE (20 MG TOTAL) BY MOUTH DAILY.  Marland Kitchen OVER THE COUNTER MEDICATION Apply 1 application topically as needed (muscle and joint  pain). Horse Liniment Topical Analgesic 4% Natural Menthol  . pravastatin (PRAVACHOL) 40 MG tablet TAKE ONE TABLET BY MOUTH DAILY  . quinapril-hydrochlorothiazide (ACCURETIC) 20-12.5 MG tablet TAKE 2 TABLETS BY MOUTH DAILY.  Marland Kitchen rOPINIRole (REQUIP) 1 MG tablet Take 1 tablet (1 mg total) by mouth at bedtime.   No facility-administered encounter medications on file as of 04/22/2018.     Activities of Daily Living In your present state of health, do you have any difficulty performing the following activities: 04/22/2018 06/28/2017  Hearing? N N  Vision? N N  Comment - wears glasses  Difficulty concentrating or making decisions? N N  Walking or climbing stairs? N Y  Comment - not as study on my feet as I used to be.  Dressing or bathing? N N  Doing errands, shopping? N N  Preparing Food and eating ? N -  Using the Toilet? N -  In the past six months, have you accidently  leaked urine? Y -  Comment Rarely at night if does not go when has the urge.  -  Do you have problems with loss of bowel control? N -  Managing your Medications? N -  Managing your Finances? N -  Housekeeping or managing your Housekeeping? N -  Some recent data might be hidden    Patient Care  Team: Jerrol Banana., MD as PCP - General (Family Medicine) Dingeldein, Remo Lipps, MD as Consulting Physician (Ophthalmology) Corey Skains, MD as Consulting Physician (Cardiology)    Assessment:   This is a routine wellness examination for [place].  Exercise Activities and Dietary recommendations Current Exercise Habits: The patient does not participate in regular exercise at present, Exercise limited by: None identified  Goals    . DIET - INCREASE WATER INTAKE     Recommend increasing water intake to 6 glasses a day.        Fall Risk Fall Risk  04/22/2018 03/21/2017 03/21/2016  Falls in the past year? No No Yes  Number falls in past yr: - - 1  Injury with Fall? - - Yes   Is the patient's home free of loose throw rugs in walkways, pet beds, electrical cords, etc?   yes      Grab bars in the bathroom? yes      Handrails on the stairs?   yes      Adequate lighting?   yes  Timed Get Up and Go performed: N/A  Depression Screen PHQ 2/9 Scores 04/22/2018 03/21/2017 03/21/2017 03/21/2016  PHQ - 2 Score 1 1 1  0  PHQ- 9 Score - 1 - -     Cognitive Function: Pt declined screening today.      6CIT Screen 03/21/2017  What Year? 0 points  What month? 0 points  What time? 0 points  Count back from 20 0 points  Months in reverse 0 points  Repeat phrase 6 points  Total Score 6    Immunization History  Administered Date(s) Administered  . Influenza Whole 08/14/2016, 08/19/2017  . Influenza, High Dose Seasonal PF 08/01/2015  . Pneumococcal Conjugate-13 10/04/2014  . Pneumococcal Polysaccharide-23 09/22/2005  . Td 02/09/2004  . Zoster 08/09/2008    Qualifies for Shingles Vaccine? Due  for Shingles vaccine. Declined my offer to administer today. Education has been provided regarding the importance of this vaccine. Pt has been advised to call her insurance company to determine her out of pocket expense. Advised she may also receive this vaccine at her local pharmacy or Health Dept. Verbalized acceptance and understanding.  Screening Tests Health Maintenance  Topic Date Due  . INFLUENZA VACCINE  09/17/2018 (Originally 06/19/2018)  . TETANUS/TDAP  11/19/2026 (Originally 02/08/2014)  . DEXA SCAN  Completed  . PNA vac Low Risk Adult  Completed    Cancer Screenings: Lung: Low Dose CT Chest recommended if Age 52-80 years, 30 pack-year currently smoking OR have quit w/in 15years. Patient does not qualify. Breast:  Up to date on Mammogram? Yes   Up to date of Bone Density/Dexa? Yes Colorectal: Up to date  Additional Screenings:  Hepatitis C Screening: N/A     Plan:  I have personally reviewed and addressed the Medicare Annual Wellness questionnaire and have noted the following in the patient's chart:  A. Medical and social history B. Use of alcohol, tobacco or illicit drugs  C. Current medications and supplements D. Functional ability and status E.  Nutritional status F.  Physical activity G. Advance directives H. List of other physicians I.  Hospitalizations, surgeries, and ER visits in previous 12 months J.  [place] such as hearing and vision if needed, cognitive and depression L. Referrals and appointments - none  In addition, I have reviewed and discussed with patient certain preventive protocols, quality metrics, and best practice recommendations. A written personalized care plan for preventive services as well  as general preventive health recommendations were provided to patient.  See attached scanned questionnaire for additional information.   Signed,  Fabio Neighbors, LPN Nurse Health Advisor   Nurse Recommendations: Pt declined the tetanus  vaccine today.

## 2018-04-22 NOTE — Progress Notes (Signed)
Melanie Franklin  MRN: [id number] DOB: [birth year]  Subjective:  HPI     The patient is an 84 year old female who presents today for follow up of chronic health.  She was last seen this morning by the nurse health advisor.   Hypertension-Her blood pressure readings are not at goal.   BP Readings from Last 3 Encounters:  04/22/18 (!) 144/62  04/22/18 (!) 144/62  10/23/17 (!) 136/54    Hypercholesterolemia-the patient is due to have her lipids checked today.  Lab Results  Component Value Date   CHOL 207 (H) 03/21/2017   HDL 77 03/21/2017   LDLCALC 105 (H) 03/21/2017   TRIG 123 03/21/2017   CHOLHDL 2.7 03/21/2016      Patient Active Problem List   Diagnosis Date Noted  . Chest pain 06/06/2016  . Memory loss or impairment 07/11/2015  . Arthritis 05/18/2015  . Arthropathy of hand 05/18/2015  . CAD in native artery 05/18/2015  . Carpal tunnel syndrome 05/18/2015  . DDD (degenerative disc disease), cervical 05/18/2015  . Clinical depression 05/18/2015  . Gastro-esophageal reflux disease without esophagitis 05/18/2015  . Essential (primary) hypertension 05/18/2015  . Adaptive colitis 05/18/2015  . Hypercholesteremia 05/18/2015  . 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 Fluttering heart 05/18/2015  . Restless leg 05/18/2015  . Apnea, sleep 05/18/2015  . Umbilical hernia without obstruction and without gangrene 05/18/2015  . TI (tricuspid incompetence) 05/04/2015  . Obstructive apnea 09/30/2014  . MI (mitral incompetence) 09/30/2014  . Artificial cardiac pacemaker 01/27/2014  . Acquired complete AV block ([place]) 01/27/2014    Past Medical History:  Diagnosis Date  . Coronary artery disease   . Family history of adverse reaction to anesthesia    "sister had a reaction to meds given during a heart cath that almost killed her."  . GERD (gastroesophageal reflux disease)   . Hypertension   . Presence of permanent cardiac pacemaker     Social History   Socioeconomic  History  . Marital status: Widowed    Spouse name: Not on file  . Number of children: 2  . Years of education: Not on file  . Highest education level: 12th grade  Occupational History  . Not on file  Social Needs  . Financial resource strain: Not hard at all  . Food insecurity:    Worry: Never true    Inability: Never true  . Transportation needs:    Medical: No    Non-medical: No  Tobacco Use  . Smoking status: Never Smoker  . Smokeless tobacco: Never Used  Substance and Sexual Activity  . Alcohol use: No  . Drug use: No  . Sexual activity: Not on file  Lifestyle  . Physical activity:    Days per week: Not on file    Minutes per session: Not on file  . Stress: Not at all  Relationships  . Social connections:    Talks on phone: Not on file    Gets together: Not on file    Attends religious service: Not on file    Active member of club or organization: Not on file    Attends meetings of clubs or organizations: Not on file    Relationship status: Not on file  . Intimate partner violence:    Fear of current or ex partner: Not on file    Emotionally abused: Not on file    Physically abused: Not on file    Forced sexual activity: Not on file  Other  Topics Concern  . Not on file  Social History Narrative  . Not on file    Outpatient Encounter Medications as of 04/22/2018  Medication Sig  . acetaminophen (TYLENOL) 650 MG CR tablet Take 650 mg by mouth 2 (two) times daily. 1 tablet in the morning and 1 at bedtime  . amLODipine (NORVASC) 5 MG tablet TAKE 1 TABLET (5 MG TOTAL) BY MOUTH DAILY.  Marland Kitchen aspirin 81 MG tablet Take 81 mg by mouth daily.   . magnesium oxide (MAG-OX) 400 MG tablet Take 400 mg by mouth daily.   . MULTIPLE VITAMINS PO Take 1 tablet by mouth daily.   . Multiple Vitamins-Minerals (ONE DAILY CALCIUM/IRON PO) Take 1 tablet by mouth daily.  Marland Kitchen omeprazole (PRILOSEC) 20 MG capsule TAKE 1 CAPSULE (20 MG TOTAL) BY MOUTH DAILY.  Marland Kitchen OVER THE COUNTER MEDICATION Apply  1 application topically as needed (muscle and joint  pain). Horse Liniment Topical Analgesic 4% Natural Menthol  . pravastatin (PRAVACHOL) 40 MG tablet TAKE ONE TABLET BY MOUTH DAILY  . quinapril-hydrochlorothiazide (ACCURETIC) 20-12.5 MG tablet TAKE 2 TABLETS BY MOUTH DAILY.  Marland Kitchen rOPINIRole (REQUIP) 1 MG tablet Take 1 tablet (1 mg total) by mouth at bedtime.   No facility-administered encounter medications on file as of 04/22/2018.     Allergies  Allergen Reactions  . Ciprofloxacin Other (See Comments)  . Meloxicam Other (See Comments)    Abdominal pain  . Tramadol Other (See Comments)    Abdominal pain   . Amoxicillin Swelling and Rash    Has patient had a PCN reaction causing immediate rash, facial/tongue/throat swelling, SOB or lightheadedness with hypotension: Unknown Has patient had a PCN reaction causing severe rash involving mucus membranes or skin necrosis: Unknown Has patient had a PCN reaction that required hospitalization: Unknown Has patient had a PCN reaction occurring within the last 10 years: Unknown If all of the above answers are "NO", then may proceed with Cephalosporin use.   . Entex Lq  [Phenylephrine-Guaifenesin] Swelling and Rash    Review of Systems  Constitutional: Negative.   HENT: Negative.   Eyes: Negative.   Respiratory: Positive for cough.        Occasional "flutter cough."  Cardiovascular: Negative.   Gastrointestinal: Negative.   Genitourinary: Negative.   Musculoskeletal: Positive for joint pain.       Fingers stiff.  Skin: Negative.   Neurological: Positive for tingling.       Tingling on occasion of middle 3 fingers of right hand.  Endo/Heme/Allergies: Negative.   Psychiatric/Behavioral: Negative.     Objective:  BP (!) 144/62   Pulse 82   Temp 98.3 F (36.8 C) (Oral)   Resp 16   Ht 5\' 2"  (1.575 m)   Wt 140 lb (63.5 kg)   BMI 25.61 kg/m   Physical Exam  Constitutional: She is oriented to person, place, and time and well-developed,  well-nourished, and in no distress.  HENT:  Head: Normocephalic and atraumatic.  Right Ear: External ear normal.  Left Ear: External ear normal.  Nose: Nose normal.  Mouth/Throat: Oropharynx is clear and moist.  Eyes: Conjunctivae are normal.  Neck: No thyromegaly present.  Cardiovascular: Normal rate, regular rhythm and normal heart sounds.  Pulmonary/Chest: Effort normal and breath sounds normal.  Abdominal: Soft.  Genitourinary:  Genitourinary Comments: Breast exam normal.  Musculoskeletal:  OA changes of hands.  Lymphadenopathy:    She has no cervical adenopathy.  Neurological: She is alert and oriented to person, place, and time.  Gait normal. GCS score is 15.  Positive Tinels or right hand.  Skin: Skin is warm and dry.  Psychiatric: Mood, memory, affect and judgment normal.    Assessment and Plan :  1. Essential (primary) hypertension Stop ACEI. Increase HCTZ to 25 mg daily. RTC 1 month. - CBC with Differential/Platelet - Comprehensive metabolic panel - TSH  2. Hypercholesteremia  - Lipid Panel With LDL/HDL Ratio 3.OA 4.Right Carpal Tunnel Syndrome 5.Cough Stop ACEI.  6.Pacemaker 7.CAD 8.HLD  I have done the exam and reviewed the chart and it is accurate to the best of my knowledge. Development worker, community has been used and  any errors in dictation or transcription are unintentional. Miguel Aschoff M.D. [facility]

## 2018-04-23 LAB — COMPREHENSIVE METABOLIC PANEL
ALT: 15 IU/L (ref 0–32)
AST: 22 IU/L (ref 0–40)
Albumin/Globulin Ratio: 2 (ref 1.2–2.2)
Albumin: 4.5 g/dL (ref 3.5–4.7)
Alkaline Phosphatase: 62 IU/L (ref 39–117)
BUN / CREAT RATIO: 23 (ref 12–28)
BUN: 12 mg/dL (ref 8–27)
Bilirubin Total: 0.5 mg/dL (ref 0.0–1.2)
CALCIUM: 9.6 mg/dL (ref 8.7–10.3)
CO2: 22 mmol/L (ref 20–29)
Chloride: 103 mmol/L (ref 96–106)
Creatinine, Ser: 0.53 mg/dL — ABNORMAL LOW (ref 0.57–1.00)
GFR calc Af Amer: 99 mL/min/{1.73_m2} (ref >59)
GFR calc non Af Amer: 86 mL/min/{1.73_m2} (ref >59)
GLUCOSE: 103 mg/dL — AB (ref 65–99)
Globulin, Total: 2.3 g/dL (ref 1.5–4.5)
POTASSIUM: 4.1 mmol/L (ref 3.5–5.2)
SODIUM: 142 mmol/L (ref 134–144)
TOTAL PROTEIN: 6.8 g/dL (ref 6.0–8.5)

## 2018-04-23 LAB — CBC WITH DIFFERENTIAL/PLATELET
BASOS ABS: 0 10*3/uL (ref 0.0–0.2)
BASOS: 0 %
EOS (ABSOLUTE): 0.1 10*3/uL (ref 0.0–0.4)
Eos: 2 %
Hematocrit: 38.9 % (ref 34.0–46.6)
Hemoglobin: 13.2 g/dL (ref 11.1–15.9)
IMMATURE GRANS (ABS): 0 10*3/uL (ref 0.0–0.1)
IMMATURE GRANULOCYTES: 0 %
LYMPHS: 32 %
Lymphocytes Absolute: 1.9 10*3/uL (ref 0.7–3.1)
MCH: 32.6 pg (ref 26.6–33.0)
MCHC: 33.9 g/dL (ref 31.5–35.7)
MCV: 96 fL (ref 79–97)
Monocytes Absolute: 0.8 10*3/uL (ref 0.1–0.9)
Monocytes: 14 %
Neutrophils Absolute: 3.1 10*3/uL (ref 1.4–7.0)
Neutrophils: 52 %
PLATELETS: 289 10*3/uL (ref 150–450)
RBC: 4.05 x10E6/uL (ref 3.77–5.28)
RDW: 12.9 % (ref 12.3–15.4)
WBC: 6 10*3/uL (ref 3.4–10.8)

## 2018-04-23 LAB — LIPID PANEL WITH LDL/HDL RATIO
CHOLESTEROL TOTAL: 177 mg/dL (ref 100–199)
HDL: 73 mg/dL (ref >39)
LDL CALC: 84 mg/dL (ref 0–99)
LDl/HDL Ratio: 1.2 ratio (ref 0.0–3.2)
TRIGLYCERIDES: 102 mg/dL (ref 0–149)
VLDL Cholesterol Cal: 20 mg/dL (ref 5–40)

## 2018-04-23 LAB — TSH: TSH: 1.02 u[IU]/mL (ref 0.450–4.500)

## 2018-04-24 NOTE — Progress Notes (Signed)
Advised  ED 

## 2018-05-09 ENCOUNTER — Other Ambulatory Visit: Payer: Self-pay | Admitting: Family Medicine

## 2018-05-09 DIAGNOSIS — Z1231 Encounter for screening mammogram for malignant neoplasm of breast: Secondary | ICD-10-CM

## 2018-05-29 ENCOUNTER — Ambulatory Visit
Admission: RE | Admit: 2018-05-29 | Discharge: 2018-05-29 | Disposition: A | Discharge status: Home / Self Care | Payer: Medicare Other | Source: Ambulatory Visit | Attending: Family Medicine | Admitting: Family Medicine

## 2018-05-29 DIAGNOSIS — Z1231 Encounter for screening mammogram for malignant neoplasm of breast: Secondary | ICD-10-CM | POA: Diagnosis not present

## 2018-07-01 ENCOUNTER — Ambulatory Visit (INDEPENDENT_AMBULATORY_CARE_PROVIDER_SITE_OTHER): Payer: Medicare Other | Admitting: Family Medicine

## 2018-07-01 VITALS — BP 148/72 | HR 62 | Temp 98.6°F | Resp 16 | Wt 139.0 lb

## 2018-07-01 DIAGNOSIS — R05 Cough: Secondary | ICD-10-CM | POA: Diagnosis not present

## 2018-07-01 DIAGNOSIS — I2510 Atherosclerotic heart disease of native coronary artery without angina pectoris: Secondary | ICD-10-CM

## 2018-07-01 DIAGNOSIS — R490 Dysphonia: Secondary | ICD-10-CM | POA: Diagnosis not present

## 2018-07-01 DIAGNOSIS — T464X5A Adverse effect of angiotensin-converting-enzyme inhibitors, initial encounter: Secondary | ICD-10-CM

## 2018-07-01 DIAGNOSIS — I10 Essential (primary) hypertension: Secondary | ICD-10-CM

## 2018-07-01 DIAGNOSIS — Z950 Presence of cardiac pacemaker: Secondary | ICD-10-CM | POA: Diagnosis not present

## 2018-07-01 NOTE — Progress Notes (Signed)
Melanie Franklin  MRN: [id number] DOB: 1929-07-16  Subjective:  HPI   The patient is an 84 year old female who presents for follow up of her hypertension.  She was last seen on 04/22/18.  At that time she was instructed to discontinue her ACEI and increase her HCTZ to 25 mg.  She was having persistent cough that was thought to be possibly coming from the ACE.  She reports that the cough has improved but is not totally gone Hoarseness has been bothering pt for some time when she gets tired or has been talking or singing. Patient Active Problem List   Diagnosis Date Noted  . Chest pain 06/06/2016  . Memory loss or impairment 07/11/2015  . Arthritis 05/18/2015  . Arthropathy of hand 05/18/2015  . CAD in native artery 05/18/2015  . Carpal tunnel syndrome 05/18/2015  . DDD (degenerative disc disease), cervical 05/18/2015  . Clinical depression 05/18/2015  . Gastro-esophageal reflux disease without esophagitis 05/18/2015  . Essential (primary) hypertension 05/18/2015  . Adaptive colitis 05/18/2015  . Hypercholesteremia 05/18/2015  . 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 Fluttering heart 05/18/2015  . Restless leg 05/18/2015  . Apnea, sleep 05/18/2015  . Umbilical hernia without obstruction and without gangrene 05/18/2015  . TI (tricuspid incompetence) 05/04/2015  . Obstructive apnea 09/30/2014  . MI (mitral incompetence) 09/30/2014  . Artificial cardiac pacemaker 01/27/2014  . Acquired complete AV block ([place]) 01/27/2014    Past Medical History:  Diagnosis Date  . Coronary artery disease   . Family history of adverse reaction to anesthesia    "sister had a reaction to meds given during a heart cath that almost killed her."  . GERD (gastroesophageal reflux disease)   . Hypertension   . Presence of permanent cardiac pacemaker     Social History   Socioeconomic History  . Marital status: Widowed    Spouse name: Not on file  . Number of children: 2  . Years of education: Not  on file  . Highest education level: 12th grade  Occupational History  . Not on file  Social Needs  . Financial resource strain: Not hard at all  . Food insecurity:    Worry: Never true    Inability: Never true  . Transportation needs:    Medical: No    Non-medical: No  Tobacco Use  . Smoking status: Never Smoker  . Smokeless tobacco: Never Used  Substance and Sexual Activity  . Alcohol use: No  . Drug use: No  . Sexual activity: Not on file  Lifestyle  . Physical activity:    Days per week: Not on file    Minutes per session: Not on file  . Stress: Not at all  Relationships  . Social connections:    Talks on phone: Not on file    Gets together: Not on file    Attends religious service: Not on file    Active member of club or organization: Not on file    Attends meetings of clubs or organizations: Not on file    Relationship status: Not on file  . Intimate partner violence:    Fear of current or ex partner: Not on file    Emotionally abused: Not on file    Physically abused: Not on file    Forced sexual activity: Not on file  Other Topics Concern  . Not on file  Social History Narrative  . Not on file    Outpatient Encounter Medications as of 07/01/2018  Medication Sig  . acetaminophen (TYLENOL) 650 MG CR tablet Take 650 mg by mouth 2 (two) times daily. 1 tablet in the morning and 1 at bedtime  . amLODipine (NORVASC) 5 MG tablet TAKE 1 TABLET (5 MG TOTAL) BY MOUTH DAILY.  Marland Kitchen aspirin 81 MG tablet Take 81 mg by mouth daily.   . hydrochlorothiazide (HYDRODIURIL) 25 MG tablet Take 1 tablet (25 mg total) by mouth daily.  . magnesium oxide (MAG-OX) 400 MG tablet Take 400 mg by mouth daily.   . MULTIPLE VITAMINS PO Take 1 tablet by mouth daily.   . Multiple Vitamins-Minerals (ONE DAILY CALCIUM/IRON PO) Take 1 tablet by mouth daily.  Marland Kitchen omeprazole (PRILOSEC) 20 MG capsule TAKE 1 CAPSULE (20 MG TOTAL) BY MOUTH DAILY.  Marland Kitchen OVER THE COUNTER MEDICATION Apply 1 application topically  as needed (muscle and joint  pain). Horse Liniment Topical Analgesic 4% Natural Menthol  . pravastatin (PRAVACHOL) 40 MG tablet TAKE ONE TABLET BY MOUTH DAILY  . rOPINIRole (REQUIP) 1 MG tablet Take 1 tablet (1 mg total) by mouth at bedtime.   No facility-administered encounter medications on file as of 07/01/2018.     Allergies  Allergen Reactions  . Ciprofloxacin Other (See Comments)  . Meloxicam Other (See Comments)    Abdominal pain  . Tramadol Other (See Comments)    Abdominal pain   . Amoxicillin Swelling and Rash    Has patient had a PCN reaction causing immediate rash, facial/tongue/throat swelling, SOB or lightheadedness with hypotension: Unknown Has patient had a PCN reaction causing severe rash involving mucus membranes or skin necrosis: Unknown Has patient had a PCN reaction that required hospitalization: Unknown Has patient had a PCN reaction occurring within the last 10 years: Unknown If all of the above answers are "NO", then may proceed with Cephalosporin use.   . Entex Lq  [Phenylephrine-Guaifenesin] Swelling and Rash    Review of Systems  Constitutional: Negative for fever and malaise/fatigue.  HENT:       Pt says as she gets oder she has issues that her voice becomes hoarse after talking or when she gets tired.  Eyes: Negative.   Respiratory: Positive for hemoptysis. Negative for cough, shortness of breath and wheezing.   Cardiovascular: Negative for chest pain, palpitations and leg swelling.  Gastrointestinal: Negative.   Musculoskeletal: Positive for back pain and joint pain.  Neurological: Negative.   Endo/Heme/Allergies: Negative.   Psychiatric/Behavioral: Negative.     Objective:  BP (!) 148/72 (BP Location: Right Arm, Patient Position: Sitting, Cuff Size: Normal)   Pulse 62   Temp 98.6 F (37 C) (Oral)   Resp 16   Wt 139 lb (63 kg)   BMI 25.42 kg/m   Physical Exam  Constitutional: She is oriented to person, place, and time and well-developed,  well-nourished, and in no distress.  HENT:  Head: Normocephalic and atraumatic.  Right Ear: External ear normal.  Left Ear: External ear normal.  Nose: Nose normal.  Mouth/Throat: Oropharynx is clear and moist.  Eyes: Conjunctivae are normal. No scleral icterus.  Neck: No thyromegaly present.  Cardiovascular: Normal rate, regular rhythm, normal heart sounds and intact distal pulses.  Pulmonary/Chest: Effort normal and breath sounds normal.  Abdominal: Soft.  Musculoskeletal: She exhibits no edema.  Neurological: She is alert and oriented to person, place, and time. Gait normal. GCS score is 15.  Skin: Skin is warm and dry.  Psychiatric: Mood, memory, affect and judgment normal.    Assessment and Plan :  HTN  Good BP control at home. Hoarseness Of of ACEI,on GERD Rx. Refer to ENT for pt. Pacemaker CAD Cough  I have done the exam and reviewed the chart and it is accurate to the best of my knowledge. Development worker, community has been used and  any errors in dictation or transcription are unintentional. Miguel Aschoff M.D. [facility]

## 2018-07-11 DIAGNOSIS — K219 Gastro-esophageal reflux disease without esophagitis: Secondary | ICD-10-CM | POA: Diagnosis not present

## 2018-07-11 DIAGNOSIS — R490 Dysphonia: Secondary | ICD-10-CM | POA: Diagnosis not present

## 2018-07-23 ENCOUNTER — Other Ambulatory Visit: Payer: Self-pay | Admitting: Family Medicine

## 2018-07-23 DIAGNOSIS — K219 Gastro-esophageal reflux disease without esophagitis: Secondary | ICD-10-CM

## 2018-07-29 ENCOUNTER — Ambulatory Visit: Payer: Medicare Other | Attending: Otolaryngology | Admitting: Speech Pathology

## 2018-07-29 ENCOUNTER — Encounter: Payer: Self-pay | Admitting: Speech Pathology

## 2018-07-29 ENCOUNTER — Other Ambulatory Visit: Payer: Self-pay

## 2018-07-29 DIAGNOSIS — R490 Dysphonia: Secondary | ICD-10-CM

## 2018-07-29 NOTE — Therapy (Signed)
[place] [facility] SERVICES [address] [place], [place], [address] Phone: [phone]   Fax:  [phone]  Speech Language Pathology Evaluation  Patient Details  Name: Melanie Franklin MRN: [id number] Date of Birth: 03/03/1929 Referring Provider: Carloyn Manner   Encounter Date: 07/29/2018  End of Session - 07/29/18 1022    Visit Number  1    Number of Visits  17    Date for SLP Re-Evaluation  09/28/18    SLP Start Time  0900    SLP Stop Time   0952    SLP Time Calculation (min)  52 min    Activity Tolerance  Patient tolerated treatment well       Past Medical History:  Diagnosis Date   Coronary artery disease    Family history of adverse reaction to anesthesia    "sister had a reaction to meds given during a heart cath that almost killed her."   GERD (gastroesophageal reflux disease)    Hypertension    Presence of permanent cardiac pacemaker     Past Surgical History:  Procedure Laterality Date   CARDIAC CATHETERIZATION     CATARACT EXTRACTION Bilateral    CORONARY ARTERY BYPASS GRAFT  03/09/2009   x 4 [place] ARTHROSCOPY Left    LASIK     PACEMAKER INSERTION     PACEMAKER INSERTION Left 07/12/2017   Procedure: PACEMAKER CHANGE OUT;  Surgeon: Marzetta Board, MD;  Location: [facility] ORS;  Service: Cardiovascular;  Laterality: Left;   TONSILLECTOMY      There were no vitals filed for this visit.      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 Pryor Ochoa for voice therapy.        Prior Functional Status   Cognitive/Linguistic Baseline  Within  functional limits      Oral Motor/Sensory Function   Overall Oral Motor/Sensory Function  Appears within functional limits for tasks assessed      Motor Speech   Overall Motor Speech  Impaired    Respiration  Impaired    Level of Impairment  Conversation    Phonation  Breathy;Hoarse;Low vocal intensity    Resonance  Within functional limits    Articulation  Within functional limitis    Intelligibility  Intelligible    Phonation  Impaired    Vocal Abuses  Habitual Hyperphonia;Habitual Cough/Throat Clear;Vocal Fold Dehydration    Tension Present  Jaw;Neck;Shoulder    Volume  Soft    Pitch  Low      Standardized Assessments   Standardized Assessments   Other Assessment   Perceptual Voice Evaluation      Perceptual Voice Evaluation Voice checklist:  Health risks: GERD  Characteristic voice use: does not talk much most day, lives alone   Environmental risks: no significant environmental risks  Misuse:   Abuse: coughing/throat clearing  Vocal characteristics: breathy, hoarse, limited voice range, poor vocal projection, excessive pharyngeal resonance, glottal fry, monotone, low habitual pitch Patient quality of life  survey: VHI-10: 4 (A score of 10 or higher indicate voice handicap) Maximum phonation time for sustained ah: 15 seconds Average fundamental frequency during sustained ah: 209 Hz (1.3 STD below average for gender) Habitual pitch: 172 Hz (2.7 STD below average for gender) Highest dynamic pitch when altering pitch from a low note to a high note: 330 Hz Lowest dynamic pitch when altering from a high note to a low note: 145 Hz Highest dynamic pitch in conversational speech: 196 Hz Lowest dynamic pitch in conversational speech: 184 Hz Average time patient was able to sustain /s/: 7 seconds Average time patient was able to sustain /z/: patient could not sustain phonated fricative s/z ratio : N/A Visi-Pitch: Multi-Dimensional Voice Program (MDVP)  MDVP extracts  objective quantitative values (Relative Average Perturbation, Shimmer, and Noise to Harmonic Ratio) on sustained phonation, which are displayed graphically and numerically in comparison to a built-in normative database.  The patient exhibited values outside the norm for Relative Average Perturbation, Shimmer, Voice Turbulence Index, and Noise to Harmonic Ratio.  Average fundamental frequency was 1.3 STD below average for age and gender.  Patient was not stimulable today to improve parameters with models and verbal explanation.    Education: Patient instructed in breath support exercises   ADULT SLP TREATMENT - 07/29/18 0001      General Information   HPI  Carloyn Manner        SLP Education - 07/29/18 1022    Education Details  voice therapy    Person(s) Educated  Patient    Methods  Explanation    Comprehension  Verbalized understanding         SLP Long Term Goals - 07/29/18 1024      SLP LONG TERM GOAL #1   Title  The patient will demonstrate independent understanding of vocal hygiene concepts and extrinsic laryngeal muscle stretches.      Time  8    Period  Weeks    Status  New    Target Date  09/28/18      SLP LONG TERM GOAL #2   Title  The patient will be independent for abdominal breathing and breath support exercises.    Time  8    Period  Weeks    Status  New    Target Date  09/28/18      SLP LONG TERM GOAL #3   Title  The patient will minimize vocal tension via resonant voice therapy (or comparable technique) with min SLP cues with 80% accuracy.    Time  8    Period  Weeks    Status  New    Target Date  09/28/18      SLP LONG TERM GOAL #4   Title  The patient will maintain relaxed phonation / oral resonance for paragraph length recitation with 80% accuracy.    Time  8    Period  Weeks    Status  New    Target Date  09/28/18       Plan - 07/29/18 1022    Clinical Impression Statement  This 84 year old woman under the care of Dr. Pryor Ochoa, with  presbylaryngis, is presenting with moderate dysphonia.  The patient demonstrates breathy/ hoarse vocal quality, limited voice range, poor vocal projection, excessive pharyngeal resonance, glottal fry, monotone, and low habitual pitch.  She will benefit from voice therapy for education, to improve breath support, improve tone focus, promote easy flow phonation, and learn techniques to increase loudness and pitch range  without strain.    Speech Therapy Frequency  2x / week    Duration  Other (comment)   8 weeks   Treatment/Interventions  SLP instruction and feedback;Patient/family education;Other (comment)   Voice therapy   Potential to Achieve Goals  Good    Potential Considerations  Ability to learn/carryover information;Pain level;Family/community support;Co-morbidities;Previous level of function;Cooperation/participation level;Severity of impairments;Medical prognosis    SLP Home Exercise Plan  breath support exercises    Consulted and Agree with Plan of Care  Patient       Patient will benefit from skilled therapeutic intervention in order to improve the following deficits and impairments:   Dysphonia - Plan: SLP plan of care cert/re-cert    Problem List Patient Active Problem List   Diagnosis Date Noted   Chest pain 06/06/2016   Memory loss or impairment 07/11/2015   Arthritis 05/18/2015   Arthropathy of hand 05/18/2015   CAD in native artery 05/18/2015   Carpal tunnel syndrome 05/18/2015   DDD (degenerative disc disease), cervical 05/18/2015   Clinical depression 05/18/2015   Gastro-esophageal reflux disease without esophagitis 05/18/2015   Essential (primary) hypertension 05/18/2015   Adaptive colitis 05/18/2015   Hypercholesteremia 05/18/2015   Detrusor muscle hypertonia 05/18/2015   Fluttering heart 05/18/2015   Restless leg 05/18/2015   Apnea, sleep 36/68/1594   Umbilical hernia without obstruction and without gangrene 05/18/2015   TI (tricuspid  incompetence) 05/04/2015   Obstructive apnea 09/30/2014   MI (mitral incompetence) 09/30/2014   Artificial cardiac pacemaker 01/27/2014   Acquired complete AV block ([place]) 01/27/2014   Leroy Sea, MS/CCC- SLP  Lou Miner 07/29/2018, 10:28 AM  [facility] SERVICES [address] [place], [place], [address] Phone: [phone]   Fax:  [phone]  Name: ANALESE SOVINE MRN: [id number] Date of Birth: 27-Feb-1929

## 2018-08-05 DIAGNOSIS — I442 Atrioventricular block, complete: Secondary | ICD-10-CM | POA: Diagnosis not present

## 2018-08-05 DIAGNOSIS — I10 Essential (primary) hypertension: Secondary | ICD-10-CM | POA: Diagnosis not present

## 2018-08-05 DIAGNOSIS — Z950 Presence of cardiac pacemaker: Secondary | ICD-10-CM | POA: Diagnosis not present

## 2018-08-05 DIAGNOSIS — E782 Mixed hyperlipidemia: Secondary | ICD-10-CM | POA: Diagnosis not present

## 2018-08-05 DIAGNOSIS — G4733 Obstructive sleep apnea (adult) (pediatric): Secondary | ICD-10-CM | POA: Diagnosis not present

## 2018-08-05 DIAGNOSIS — I25810 Atherosclerosis of coronary artery bypass graft(s) without angina pectoris: Secondary | ICD-10-CM | POA: Diagnosis not present

## 2018-08-07 ENCOUNTER — Other Ambulatory Visit: Payer: Self-pay | Admitting: Family Medicine

## 2018-08-13 ENCOUNTER — Encounter: Payer: Self-pay | Admitting: Speech Pathology

## 2018-08-13 ENCOUNTER — Ambulatory Visit: Payer: Medicare Other | Admitting: Speech Pathology

## 2018-08-13 DIAGNOSIS — R490 Dysphonia: Secondary | ICD-10-CM | POA: Diagnosis not present

## 2018-08-13 NOTE — Therapy (Signed)
[place] MAIN [facility] SERVICES [address] [place], [place], [address] Phone: [phone]   Fax:  [phone]  Speech Language Pathology Treatment  Patient Details  Name: Melanie Franklin MRN: [id number] Date of Birth: 09-08-1929 Referring Provider: Carloyn Manner   Encounter Date: 08/13/2018  End of Session - 08/13/18 1450    Visit Number  2    Number of Visits  17    Date for SLP Re-Evaluation  09/28/18    SLP Start Time  0900    SLP Stop Time   0950    SLP Time Calculation (min)  50 min    Activity Tolerance  Patient tolerated treatment well       Past Medical History:  Diagnosis Date  . Coronary artery disease   . Family history of adverse reaction to anesthesia    "sister had a reaction to meds given during a heart cath that almost killed her."  . GERD (gastroesophageal reflux disease)   . Hypertension   . Presence of permanent cardiac pacemaker     Past Surgical History:  Procedure Laterality Date  . CARDIAC CATHETERIZATION    . CATARACT EXTRACTION Bilateral   . CORONARY ARTERY BYPASS GRAFT  03/09/2009   x [facility]  . ESOPHAGEAL DILATION    . KNEE ARTHROSCOPY Left   . LASIK    . PACEMAKER INSERTION    . PACEMAKER INSERTION Left 07/12/2017   Procedure: PACEMAKER CHANGE OUT;  Surgeon: Marzetta Board, MD;  Location: [facility] ORS;  Service: Cardiovascular;  Laterality: Left;  . TONSILLECTOMY      There were no vitals filed for this visit.  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 Pryor Ochoa for voice therapy.         Treatment Provided   Treatment provided  Cognitive-Linquistic      Pain Assessment   Pain Assessment  No/denies pain      Cognitive-Linquistic  Treatment   Treatment focused on  Voice    Skilled Treatment  The patient was provided with written and verbal teaching regarding neck, shoulder, tongue, and throat stretches exercises to promote relaxed phonation. The patient was provided with written and verbal teaching for supplement vocal tract relaxation exercises (straw phonation).  The patient was provided with written and verbal teaching regarding breath support exercises.  The patient was provided with verbal, written and recorded instruction in resonant voice exercises:  Hum- Sustained, Hum- Siren, hum- Vowels, Hum- Descending glides, Hum- Ascending glides, Hum- word level, Hum- Phrase level, Hum- Sentence level.  The patient is able to generate a relaxed phonation across all tasks at 75% or greater accuracy.      Assessment / Recommendations / Plan   Plan  Continue with current plan of care      Progression Toward Goals   Progression toward goals  Progressing toward goals       SLP Education - 08/13/18 1449    Education Details  resonant voice therapy    Person(s) Educated  Patient    Methods  Explanation;Handout;Demonstration    Comprehension  Verbalized understanding         SLP Long Term Goals -  07/29/18 1024      SLP LONG TERM GOAL #1   Title  The patient will demonstrate independent understanding of vocal hygiene concepts and extrinsic laryngeal muscle stretches.      Time  8    Period  Weeks    Status  New    Target Date  09/28/18      SLP LONG TERM GOAL #2   Title  The patient will be independent for abdominal breathing and breath support exercises.    Time  8    Period  Weeks    Status  New    Target Date  09/28/18      SLP LONG TERM GOAL #3   Title  The patient will minimize vocal tension via resonant voice therapy (or comparable technique) with min SLP cues with 80% accuracy.    Time  8    Period  Weeks    Status  New    Target Date  09/28/18      SLP LONG TERM GOAL #4   Title  The patient will  maintain relaxed phonation / oral resonance for paragraph length recitation with 80% accuracy.    Time  8    Period  Weeks    Status  New    Target Date  09/28/18       Plan - 08/13/18 1450    Clinical Impression Statement  Patient able to improve vocal quality with nasality to improve oral resonance and semi-occluded vocal tract to decrease laryngeal strain.    Speech Therapy Frequency  2x / week    Duration  Other (comment)    Treatment/Interventions  SLP instruction and feedback;Patient/family education;Other (comment)   Voice therapy   Potential to Achieve Goals  Good    Potential Considerations  Ability to learn/carryover information;Pain level;Family/community support;Co-morbidities;Previous level of function;Cooperation/participation level;Severity of impairments;Medical prognosis    SLP Home Exercise Plan  resonant voice exercises    Consulted and Agree with Plan of Care  Patient       Patient will benefit from skilled therapeutic intervention in order to improve the following deficits and impairments:   Dysphonia    Problem List Patient Active Problem List   Diagnosis Date Noted  . Chest pain 06/06/2016  . Memory loss or impairment 07/11/2015  . Arthritis 05/18/2015  . Arthropathy of hand 05/18/2015  . CAD in native artery 05/18/2015  . Carpal tunnel syndrome 05/18/2015  . DDD (degenerative disc disease), cervical 05/18/2015  . Clinical depression 05/18/2015  . Gastro-esophageal reflux disease without esophagitis 05/18/2015  . Essential (primary) hypertension 05/18/2015  . Adaptive colitis 05/18/2015  . Hypercholesteremia 05/18/2015  . 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 Fluttering heart 05/18/2015  . Restless leg 05/18/2015  . Apnea, sleep 05/18/2015  . Umbilical hernia without obstruction and without gangrene 05/18/2015  . TI (tricuspid incompetence) 05/04/2015  . Obstructive apnea 09/30/2014  . MI (mitral incompetence) 09/30/2014  . Artificial cardiac  pacemaker 01/27/2014  . Acquired complete AV block ([place]) 01/27/2014   Leroy Sea, MS/CCC- SLP  Lou Miner 08/13/2018, 2:51 PM  [place] MAIN [facility] SERVICES [address] [place], [place], [address] Phone: [phone]   Fax:  [phone]   Name: MARNAE MADANI MRN: [id number] Date of Birth: Nov 23, 1928

## 2018-08-18 ENCOUNTER — Ambulatory Visit: Payer: Medicare Other | Admitting: Speech Pathology

## 2018-08-18 ENCOUNTER — Encounter: Payer: Self-pay | Admitting: Speech Pathology

## 2018-08-18 DIAGNOSIS — R490 Dysphonia: Secondary | ICD-10-CM | POA: Diagnosis not present

## 2018-08-18 NOTE — Therapy (Signed)
[place] MAIN [facility] SERVICES [address] [place], [place], [address] Phone: [phone]   Fax:  [phone]  Speech Language Pathology Treatment  Patient Details  Name: Melanie Franklin MRN: [id number] Date of Birth: 1929/01/28 Referring Provider (SLP): Carloyn Manner   Encounter Date: 08/18/2018  End of Session - 08/18/18 1324    Visit Number  3    Number of Visits  17    Date for SLP Re-Evaluation  09/28/18    SLP Start Time  0900    SLP Stop Time   0953    SLP Time Calculation (min)  53 min    Activity Tolerance  Patient tolerated treatment well       Past Medical History:  Diagnosis Date  . Coronary artery disease   . Family history of adverse reaction to anesthesia    "sister had a reaction to meds given during a heart cath that almost killed her."  . GERD (gastroesophageal reflux disease)   . Hypertension   . Presence of permanent cardiac pacemaker     Past Surgical History:  Procedure Laterality Date  . CARDIAC CATHETERIZATION    . CATARACT EXTRACTION Bilateral   . CORONARY ARTERY BYPASS GRAFT  03/09/2009   x [facility]  . ESOPHAGEAL DILATION    . KNEE ARTHROSCOPY Left   . LASIK    . PACEMAKER INSERTION    . PACEMAKER INSERTION Left 07/12/2017   Procedure: PACEMAKER CHANGE OUT;  Surgeon: Marzetta Board, MD;  Location: [facility] ORS;  Service: Cardiovascular;  Laterality: Left;  . TONSILLECTOMY      There were no vitals filed for this visit.  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 Pryor Ochoa for voice therapy.         Treatment Provided   Treatment provided  Cognitive-Linquistic      Pain Assessment   Pain Assessment  No/denies pain      Cognitive-Linquistic Treatment   Treatment focused on  Voice    Skilled Treatment  The patient was provided with written and verbal teaching regarding neck, shoulder, tongue, and throat stretches exercises to promote relaxed phonation. The patient was provided with written and verbal teaching for supplement vocal tract relaxation exercises (straw phonation).  The patient was provided with written and verbal teaching regarding breath support exercises.  The patient was provided with verbal, written and recorded instruction in resonant voice exercises:  Hum- Sustained, Hum- Siren, hum- Vowels, Hum- Descending glides, Hum- Ascending glides, Hum- word level, Hum- Phrase level, Hum- Sentence level.  The patient is able to generate a relaxed phonation across all tasks at 80% or greater accuracy.  Patient able to maintain clear vocal quality reading sentences aloud with 75% accuracy.      Assessment / Recommendations / Plan   Plan  Continue with current plan of care      Progression Toward Goals   Progression toward goals  Progressing toward goals       SLP Education - 08/18/18 0952    Education Details  resonant voice exercises    Person(s) Educated  Patient    Methods  Explanation    Comprehension  Verbalized understanding         SLP Long Term Goals - 07/29/18 1024      SLP LONG TERM GOAL #1   Title  The patient will demonstrate independent understanding of vocal hygiene concepts and extrinsic laryngeal muscle stretches.      Time  8    Period  Weeks    Status  New    Target Date  09/28/18      SLP LONG TERM GOAL #2   Title  The patient will be independent for abdominal breathing and breath support exercises.    Time  8    Period  Weeks    Status  New    Target Date  09/28/18      SLP LONG TERM GOAL #3   Title  The patient will minimize vocal tension via resonant voice therapy (or comparable technique) with min SLP cues with 80% accuracy.    Time  8    Period  Weeks    Status   New    Target Date  09/28/18      SLP LONG TERM GOAL #4   Title  The patient will maintain relaxed phonation / oral resonance for paragraph length recitation with 80% accuracy.    Time  8    Period  Weeks    Status  New    Target Date  09/28/18       Plan - 08/18/18 0953    Clinical Impression Statement  Patient able to improve vocal quality with nasality to improve oral resonance and semi-occluded vocal tract to decrease laryngeal strain.  Patient able to maintain clear vocal quality in reading aloud and conversation, given cues.    Speech Therapy Frequency  2x / week    Duration  Other (comment)    Treatment/Interventions  SLP instruction and feedback;Patient/family education;Other (comment)   Voice therapy   Potential to Achieve Goals  Good    Potential Considerations  Ability to learn/carryover information;Pain level;Family/community support;Co-morbidities;Previous level of function;Cooperation/participation level;Severity of impairments;Medical prognosis    SLP Home Exercise Plan  resonant voice exercises    Consulted and Agree with Plan of Care  Patient       Patient will benefit from skilled therapeutic intervention in order to improve the following deficits and impairments:   Dysphonia    Problem List Patient Active Problem List   Diagnosis Date Noted  . Chest pain 06/06/2016  . Memory loss or impairment 07/11/2015  . Arthritis 05/18/2015  . Arthropathy of hand 05/18/2015  . CAD in native artery 05/18/2015  . Carpal tunnel syndrome 05/18/2015  . DDD (degenerative disc disease), cervical 05/18/2015  . Clinical depression 05/18/2015  . Gastro-esophageal reflux disease without esophagitis 05/18/2015  . Essential (primary) hypertension 05/18/2015  . Adaptive colitis 05/18/2015  . Hypercholesteremia 05/18/2015  . 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 Fluttering heart 05/18/2015  . Restless leg 05/18/2015  . Apnea, sleep 05/18/2015  . Umbilical hernia without  obstruction and without gangrene 05/18/2015  . TI (tricuspid incompetence) 05/04/2015  . Obstructive apnea 09/30/2014  . MI (mitral incompetence) 09/30/2014  . Artificial cardiac pacemaker 01/27/2014  . Acquired complete AV block ([place]) 01/27/2014   Leroy Sea, MS/CCC- SLP  Lou Miner 08/18/2018, 9:54 AM  [place] MAIN [facility] SERVICES [address] [place], [place], [address] Phone: [phone]   Fax:  [phone]   Name: Melanie Franklin MRN: [id number] Date of Birth: October 29, 1929

## 2018-08-20 ENCOUNTER — Telehealth: Payer: Self-pay | Admitting: Family Medicine

## 2018-08-20 NOTE — Telephone Encounter (Signed)
Dec/jan f/u visit.

## 2018-08-20 NOTE — Telephone Encounter (Signed)
Appt was scheduled.

## 2018-08-20 NOTE — Telephone Encounter (Signed)
Please advise. Thanks.  

## 2018-08-20 NOTE — Telephone Encounter (Signed)
Patient wants to know when she is suppose to come back to see Dr. Rosanna Randy.  Was not given an appt when she left on last visit.

## 2018-08-21 ENCOUNTER — Ambulatory Visit: Payer: Medicare Other | Admitting: Speech Pathology

## 2018-08-23 ENCOUNTER — Ambulatory Visit (INDEPENDENT_AMBULATORY_CARE_PROVIDER_SITE_OTHER): Payer: Medicare Other

## 2018-08-23 DIAGNOSIS — Z23 Encounter for immunization: Secondary | ICD-10-CM | POA: Diagnosis not present

## 2018-09-02 DIAGNOSIS — D2271 Melanocytic nevi of right lower limb, including hip: Secondary | ICD-10-CM | POA: Diagnosis not present

## 2018-09-02 DIAGNOSIS — D225 Melanocytic nevi of trunk: Secondary | ICD-10-CM | POA: Diagnosis not present

## 2018-09-02 DIAGNOSIS — D2272 Melanocytic nevi of left lower limb, including hip: Secondary | ICD-10-CM | POA: Diagnosis not present

## 2018-09-02 DIAGNOSIS — L815 Leukoderma, not elsewhere classified: Secondary | ICD-10-CM | POA: Diagnosis not present

## 2018-09-02 DIAGNOSIS — D2262 Melanocytic nevi of left upper limb, including shoulder: Secondary | ICD-10-CM | POA: Diagnosis not present

## 2018-09-02 DIAGNOSIS — L821 Other seborrheic keratosis: Secondary | ICD-10-CM | POA: Diagnosis not present

## 2018-09-02 DIAGNOSIS — D2261 Melanocytic nevi of right upper limb, including shoulder: Secondary | ICD-10-CM | POA: Diagnosis not present

## 2018-09-03 ENCOUNTER — Ambulatory Visit: Payer: Medicare Other

## 2018-09-04 ENCOUNTER — Other Ambulatory Visit: Payer: Self-pay | Admitting: Family Medicine

## 2018-10-09 ENCOUNTER — Other Ambulatory Visit: Payer: Self-pay | Admitting: Family Medicine

## 2018-10-09 DIAGNOSIS — K219 Gastro-esophageal reflux disease without esophagitis: Secondary | ICD-10-CM

## 2018-11-21 ENCOUNTER — Telehealth: Payer: Self-pay | Admitting: Family Medicine

## 2018-11-21 NOTE — Telephone Encounter (Signed)
Pt is having no pain in her throat but she cannot talk.  She is not running a fever but is blowing anlot of congestion from her nose/sinus.  She cannot come in due to no transportation.  Pt needing to know what she needs to do or can be prescribed to her.  Please advise.  Thanks, [employer]

## 2018-11-21 NOTE — Telephone Encounter (Signed)
Patient advised.

## 2018-11-21 NOTE — Telephone Encounter (Signed)
She can take mucinex over the counter and also delsym for cough. She can use flonase nasal spray. She make tylenol for pain. She should drink plenty of fluids. I can't say for certain without seeing her but this is likely a viral illness. She should come be evaluated at [facility] if there is concern for the flu or pneumonia. She should definitely come in if she has a fever, productive cough, vomiting, falls.

## 2018-12-02 ENCOUNTER — Ambulatory Visit (INDEPENDENT_AMBULATORY_CARE_PROVIDER_SITE_OTHER): Payer: Medicare Other | Admitting: Family Medicine

## 2018-12-02 ENCOUNTER — Encounter: Payer: Self-pay | Admitting: Family Medicine

## 2018-12-02 VITALS — BP 142/68 | HR 80 | Temp 98.4°F | Wt 136.8 lb

## 2018-12-02 DIAGNOSIS — I2510 Atherosclerotic heart disease of native coronary artery without angina pectoris: Secondary | ICD-10-CM

## 2018-12-02 DIAGNOSIS — I442 Atrioventricular block, complete: Secondary | ICD-10-CM

## 2018-12-02 DIAGNOSIS — M5030 Other cervical disc degeneration, unspecified cervical region: Secondary | ICD-10-CM

## 2018-12-02 DIAGNOSIS — I10 Essential (primary) hypertension: Secondary | ICD-10-CM

## 2018-12-02 DIAGNOSIS — Z950 Presence of cardiac pacemaker: Secondary | ICD-10-CM

## 2018-12-02 DIAGNOSIS — R413 Other amnesia: Secondary | ICD-10-CM | POA: Diagnosis not present

## 2018-12-02 NOTE — Progress Notes (Signed)
Patient: Melanie Franklin Female    DOB: [birth year]   85 y.o.   MRN: [id number] Visit Date: 12/02/2018  Today's Provider: Wilhemena Durie, MD   Chief Complaint  Patient presents with  . Hypertension   Subjective:    HPI  Hypertension, follow-up:  BP Readings from Last 3 Encounters:  12/02/18 (!) 142/68  07/01/18 (!) 148/72  04/22/18 (!) 144/62    She was last seen for hypertension 5 months ago.  BP at that visit was 148/72. Management changes since that visit include no changes. She reports have not been taking medication compliance with treatment. She is not having side effects.  She is not exercising. She is adherent to low salt diet.   Outside blood pressures are . She is experiencing none.  Patient denies chest pain, chest pressure/discomfort, fatigue, irregular heart beat, palpitations and tachypnea.   Cardiovascular risk factors include none.  Use of agents associated with hypertension: none.     Weight trend: stable Wt Readings from Last 3 Encounters:  12/02/18 136 lb 12.8 oz (62.1 kg)  07/01/18 139 lb (63 kg)  04/22/18 140 lb (63.5 kg)    Current diet: well balanced  ------------------------------------------------------------------------     Allergies  Allergen Reactions  . Ciprofloxacin Other (See Comments)  . Meloxicam Other (See Comments)    Abdominal pain  . Tramadol Other (See Comments)    Abdominal pain   . Amoxicillin Swelling and Rash    Has patient had a PCN reaction causing immediate rash, facial/tongue/throat swelling, SOB or lightheadedness with hypotension: Unknown Has patient had a PCN reaction causing severe rash involving mucus membranes or skin necrosis: Unknown Has patient had a PCN reaction that required hospitalization: Unknown Has patient had a PCN reaction occurring within the last 10 years: Unknown If all of the above answers are "NO", then may proceed with Cephalosporin use.   . Entex Lq   [Phenylephrine-Guaifenesin] Swelling and Rash     Current Outpatient Medications:  .  acetaminophen (TYLENOL) 650 MG CR tablet, Take 650 mg by mouth 2 (two) times daily. 1 tablet in the morning and 1 at bedtime, Disp: , Rfl:  .  amLODipine (NORVASC) 5 MG tablet, TAKE 1 TABLET (5 MG TOTAL) BY MOUTH DAILY., Disp: 90 tablet, Rfl: 3 .  aspirin 81 MG tablet, Take 81 mg by mouth daily. , Disp: , Rfl:  .  hydrochlorothiazide (HYDRODIURIL) 25 MG tablet, Take 1 tablet (25 mg total) by mouth daily., Disp: 30 tablet, Rfl: 12 .  magnesium oxide (MAG-OX) 400 MG tablet, Take 400 mg by mouth daily. , Disp: , Rfl:  .  MULTIPLE VITAMINS PO, Take 1 tablet by mouth daily. , Disp: , Rfl:  .  Multiple Vitamins-Minerals (ONE DAILY CALCIUM/IRON PO), Take 1 tablet by mouth daily., Disp: , Rfl:  .  omeprazole (PRILOSEC) 20 MG capsule, TAKE 1 CAPSULE (20 MG TOTAL) BY MOUTH DAILY., Disp: 90 capsule, Rfl: 3 .  OVER THE COUNTER MEDICATION, Apply 1 application topically as needed (muscle and joint  pain). Horse Liniment Topical Analgesic 4% Natural Menthol, Disp: , Rfl:  .  pravastatin (PRAVACHOL) 40 MG tablet, TAKE ONE TABLET BY MOUTH DAILY, Disp: 90 tablet, Rfl: 3 .  rOPINIRole (REQUIP) 1 MG tablet, Take 1 tablet (1 mg total) by mouth at bedtime., Disp: 30 tablet, Rfl: 5  Review of Systems  Constitutional: Negative.   HENT: Negative.   Eyes: Negative.   Respiratory: Negative.   Cardiovascular: Negative.  Gastrointestinal: Negative.   Endocrine: Negative.   Allergic/Immunologic: Negative.   Hematological: Negative.   Psychiatric/Behavioral: Negative.     Social History   Tobacco Use  . Smoking status: Never Smoker  . Smokeless tobacco: Never Used  Substance Use Topics  . Alcohol use: No      Objective:   BP (!) 142/68 (BP Location: Left Arm, Patient Position: Sitting, Cuff Size: Normal)   Pulse 80   Temp 98.4 F (36.9 C) (Oral)   Wt 136 lb 12.8 oz (62.1 kg)   SpO2 96%   BMI 25.02 kg/m  Vitals:     12/02/18 0915  BP: (!) 142/68  Pulse: 80  Temp: 98.4 F (36.9 C)  TempSrc: Oral  SpO2: 96%  Weight: 136 lb 12.8 oz (62.1 kg)     Physical Exam Constitutional:      Appearance: Normal appearance. She is well-developed and normal weight.  HENT:     Head: Normocephalic.     Right Ear: External ear normal.     Left Ear: External ear normal.     Nose: Nose normal.  Eyes:     General: No scleral icterus.    Conjunctiva/sclera: Conjunctivae normal.  Neck:     Musculoskeletal: Normal range of motion.  Cardiovascular:     Rate and Rhythm: Normal rate and regular rhythm.     Heart sounds: Normal heart sounds. No murmur.  Pulmonary:     Effort: Pulmonary effort is normal. No respiratory distress.     Breath sounds: Normal breath sounds.  Abdominal:     Palpations: Abdomen is soft.  Musculoskeletal: Normal range of motion.  Skin:    General: Skin is warm and dry.  Neurological:     General: No focal deficit present.     Mental Status: She is alert and oriented to person, place, and time.  Psychiatric:        Mood and Affect: Mood normal.        Behavior: Behavior normal.        Thought Content: Thought content normal.        Judgment: Judgment normal.         Assessment & Plan    1. CAD in native artery Risk factors treated including hyperlipidemia  2. Essential (primary) hypertension   3. Acquired complete AV block ([place]) Pacemaker in place  4. DDD (degenerative disc disease), cervical   5. Memory loss or impairment Very mild an 85 year old.  Low up this summer. More than 50% of 30-minute visit is spent in counseling and coordination of care 6. Artificial cardiac pacemaker   I, Porsha McClurkin, CMA, am acting as a scribe for Wilhemena Durie, MD.    I have done the exam and reviewed the above chart and it is accurate to the best of my knowledge. Development worker, community has been used in this note in any air is in the dictation or transcription are  unintentional.  Wilhemena Durie, MD  [place]

## 2019-04-05 ENCOUNTER — Other Ambulatory Visit: Payer: Self-pay | Admitting: Family Medicine

## 2019-04-16 DIAGNOSIS — I442 Atrioventricular block, complete: Secondary | ICD-10-CM | POA: Diagnosis not present

## 2019-04-28 DIAGNOSIS — G4733 Obstructive sleep apnea (adult) (pediatric): Secondary | ICD-10-CM | POA: Diagnosis not present

## 2019-04-28 DIAGNOSIS — I442 Atrioventricular block, complete: Secondary | ICD-10-CM | POA: Diagnosis not present

## 2019-04-28 DIAGNOSIS — I10 Essential (primary) hypertension: Secondary | ICD-10-CM | POA: Diagnosis not present

## 2019-04-28 DIAGNOSIS — E782 Mixed hyperlipidemia: Secondary | ICD-10-CM | POA: Diagnosis not present

## 2019-04-28 DIAGNOSIS — I25810 Atherosclerosis of coronary artery bypass graft(s) without angina pectoris: Secondary | ICD-10-CM | POA: Diagnosis not present

## 2019-04-29 ENCOUNTER — Ambulatory Visit: Payer: Medicare Other

## 2019-04-29 ENCOUNTER — Encounter: Payer: Medicare Other | Admitting: Family Medicine

## 2019-05-14 DIAGNOSIS — I2510 Atherosclerotic heart disease of native coronary artery without angina pectoris: Secondary | ICD-10-CM | POA: Diagnosis not present

## 2019-05-14 DIAGNOSIS — I25810 Atherosclerosis of coronary artery bypass graft(s) without angina pectoris: Secondary | ICD-10-CM | POA: Diagnosis not present

## 2019-05-14 DIAGNOSIS — I6523 Occlusion and stenosis of bilateral carotid arteries: Secondary | ICD-10-CM | POA: Diagnosis not present

## 2019-05-18 DIAGNOSIS — I25810 Atherosclerosis of coronary artery bypass graft(s) without angina pectoris: Secondary | ICD-10-CM | POA: Diagnosis not present

## 2019-05-18 DIAGNOSIS — I6523 Occlusion and stenosis of bilateral carotid arteries: Secondary | ICD-10-CM | POA: Diagnosis not present

## 2019-05-18 DIAGNOSIS — E782 Mixed hyperlipidemia: Secondary | ICD-10-CM | POA: Diagnosis not present

## 2019-05-18 DIAGNOSIS — I10 Essential (primary) hypertension: Secondary | ICD-10-CM | POA: Diagnosis not present

## 2019-05-18 DIAGNOSIS — I442 Atrioventricular block, complete: Secondary | ICD-10-CM | POA: Diagnosis not present

## 2019-08-11 DIAGNOSIS — I442 Atrioventricular block, complete: Secondary | ICD-10-CM | POA: Diagnosis not present

## 2019-08-17 ENCOUNTER — Other Ambulatory Visit: Payer: Self-pay | Admitting: Family Medicine

## 2019-08-18 ENCOUNTER — Ambulatory Visit: Payer: Medicare Other

## 2019-08-18 ENCOUNTER — Encounter: Payer: Medicare Other | Admitting: Family Medicine

## 2019-08-24 NOTE — Progress Notes (Signed)
This encounter was created in error - please disregard.

## 2019-08-25 ENCOUNTER — Other Ambulatory Visit: Payer: Self-pay

## 2019-08-25 ENCOUNTER — Telehealth: Payer: Self-pay

## 2019-08-25 NOTE — Telephone Encounter (Signed)
Called pt to complete telephonic AWV and pt declined this visit. Pt states she must have misunderstood when apt was scheduled. Pt does not want to complete and AWV this year. FYI to PCP!

## 2019-09-01 DIAGNOSIS — D2261 Melanocytic nevi of right upper limb, including shoulder: Secondary | ICD-10-CM | POA: Diagnosis not present

## 2019-09-01 DIAGNOSIS — D2262 Melanocytic nevi of left upper limb, including shoulder: Secondary | ICD-10-CM | POA: Diagnosis not present

## 2019-09-01 DIAGNOSIS — L538 Other specified erythematous conditions: Secondary | ICD-10-CM | POA: Diagnosis not present

## 2019-09-01 DIAGNOSIS — D2272 Melanocytic nevi of left lower limb, including hip: Secondary | ICD-10-CM | POA: Diagnosis not present

## 2019-09-01 DIAGNOSIS — D225 Melanocytic nevi of trunk: Secondary | ICD-10-CM | POA: Diagnosis not present

## 2019-09-01 DIAGNOSIS — D2271 Melanocytic nevi of right lower limb, including hip: Secondary | ICD-10-CM | POA: Diagnosis not present

## 2019-09-01 DIAGNOSIS — L821 Other seborrheic keratosis: Secondary | ICD-10-CM | POA: Diagnosis not present

## 2019-09-01 DIAGNOSIS — L820 Inflamed seborrheic keratosis: Secondary | ICD-10-CM | POA: Diagnosis not present

## 2019-09-29 NOTE — Progress Notes (Signed)
Patient: Melanie Franklin, Female    DOB: May 23, 1929, 85 y.o.   MRN: [id number] Visit Date: 09/30/2019  Today's Provider: Wilhemena Durie, MD   Chief Complaint  Patient presents with  . Medicare Wellness   Subjective:     Annual wellness visit Melanie Franklin is a 85 y.o. female. She feels fairly well. She reports exercising some. She reports she is sleeping poorly.  Colonoscopy- 04/29/2006. Internal hemorrhoids, otherwise normal.  Mammogram- 05/29/2018. Normal.   Review of Systems  Constitutional: Negative.   HENT: Positive for trouble swallowing.   Eyes: Negative.   Respiratory: Negative.   Cardiovascular: Positive for chest pain.  Endocrine: Positive for polyuria.  Musculoskeletal: Positive for back pain.  Allergic/Immunologic: Negative.   Neurological: Negative.   Hematological: Negative.   Psychiatric/Behavioral: Negative.     Social History   Socioeconomic History  . Marital status: Widowed    Spouse name: Not on file  . Number of children: 2  . Years of education: Not on file  . Highest education level: 12th grade  Occupational History  . Not on file  Social Needs  . Financial resource strain: Not hard at all  . Food insecurity    Worry: Never true    Inability: Never true  . Transportation needs    Medical: No    Non-medical: No  Tobacco Use  . Smoking status: Never Smoker  . Smokeless tobacco: Never Used  Substance and Sexual Activity  . Alcohol use: No  . Drug use: No  . Sexual activity: Not on file  Lifestyle  . Physical activity    Days per week: Not on file    Minutes per session: Not on file  . Stress: Not at all  Relationships  . Social Herbalist on phone: Not on file    Gets together: Not on file    Attends religious service: Not on file    Active member of club or organization: Not on file    Attends meetings of clubs or organizations: Not on file    Relationship status: Not on file  . Intimate partner violence   Fear of current or ex partner: Not on file    Emotionally abused: Not on file    Physically abused: Not on file    Forced sexual activity: Not on file  Other Topics Concern  . Not on file  Social History Narrative  . Not on file    Past Medical History:  Diagnosis Date  . Coronary artery disease   . Family history of adverse reaction to anesthesia    "sister had a reaction to meds given during a heart cath that almost killed her."  . GERD (gastroesophageal reflux disease)   . Hypertension   . Presence of permanent cardiac pacemaker      Patient Active Problem List   Diagnosis Date Noted  . Memory loss or impairment 07/11/2015  . Arthritis 05/18/2015  . Arthropathy of hand 05/18/2015  . CAD in native artery 05/18/2015  . Carpal tunnel syndrome 05/18/2015  . DDD (degenerative disc disease), cervical 05/18/2015  . Clinical depression 05/18/2015  . Gastro-esophageal reflux disease without esophagitis 05/18/2015  . Essential (primary) hypertension 05/18/2015  . Adaptive colitis 05/18/2015  . Hypercholesteremia 05/18/2015  . 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 Fluttering heart 05/18/2015  . Restless leg 05/18/2015  . Apnea, sleep 05/18/2015  . Umbilical hernia without obstruction and without gangrene 05/18/2015  . TI (  tricuspid incompetence) 05/04/2015  . Obstructive apnea 09/30/2014  . MI (mitral incompetence) 09/30/2014  . Artificial cardiac pacemaker 01/27/2014  . Acquired complete AV block ([place]) 01/27/2014    Past Surgical History:  Procedure Laterality Date  . CARDIAC CATHETERIZATION    . CATARACT EXTRACTION Bilateral   . CORONARY ARTERY BYPASS GRAFT  03/09/2009   x [facility]  . ESOPHAGEAL DILATION    . KNEE ARTHROSCOPY Left   . LASIK    . PACEMAKER INSERTION    . PACEMAKER INSERTION Left 07/12/2017   Procedure: PACEMAKER CHANGE OUT;  Surgeon: Marzetta Board, MD;  Location: [facility] ORS;  Service: Cardiovascular;  Laterality: Left;  .  TONSILLECTOMY      Her family history includes CAD in her sister; Cancer in her sister; Cerebrovascular Accident in her mother; Colon cancer in her mother; Congestive Heart Failure in her sister; Diabetes in her sister; Hypertension in her brother, mother, and sister; Kidney Stones in her sister; Other in her mother and sister; Stroke in her father; Ulcers in her mother. There is no history of Breast cancer.   Current Outpatient Medications:  .  acetaminophen (TYLENOL) 650 MG CR tablet, Take 650 mg by mouth 2 (two) times daily. 1 tablet in the morning and 1 at bedtime, Disp: , Rfl:  .  amLODipine (NORVASC) 5 MG tablet, TAKE 1 TABLET (5 MG TOTAL) BY MOUTH DAILY., Disp: 90 tablet, Rfl: 3 .  aspirin 81 MG tablet, Take 81 mg by mouth daily. , Disp: , Rfl:  .  hydrochlorothiazide (HYDRODIURIL) 25 MG tablet, TAKE ONE TABLET BY MOUTH DAILY, Disp: 90 tablet, Rfl: 1 .  magnesium oxide (MAG-OX) 400 MG tablet, Take 400 mg by mouth daily. , Disp: , Rfl:  .  MULTIPLE VITAMINS PO, Take 1 tablet by mouth daily. , Disp: , Rfl:  .  Multiple Vitamins-Minerals (ONE DAILY CALCIUM/IRON PO), Take 1 tablet by mouth daily., Disp: , Rfl:  .  omeprazole (PRILOSEC) 20 MG capsule, TAKE 1 CAPSULE (20 MG TOTAL) BY MOUTH DAILY., Disp: 90 capsule, Rfl: 3 .  OVER THE COUNTER MEDICATION, Apply 1 application topically as needed (muscle and joint  pain). Horse Liniment Topical Analgesic 4% Natural Menthol, Disp: , Rfl:  .  pravastatin (PRAVACHOL) 40 MG tablet, TAKE ONE TABLET BY MOUTH DAILY, Disp: 90 tablet, Rfl: 4 .  rOPINIRole (REQUIP) 1 MG tablet, Take 1 tablet (1 mg total) by mouth at bedtime., Disp: 30 tablet, Rfl: 5  Patient Care Team: Jerrol Banana., MD as PCP - General (Family Medicine) Dingeldein, Remo Lipps, MD as Consulting Physician (Ophthalmology) Corey Skains, MD as Consulting Physician (Cardiology)    Objective:    Vitals: BP (!) 144/86 (BP Location: Left Arm, Patient Position: Sitting, Cuff Size:  Large)   Pulse 86   Temp (!) 97.3 F (36.3 C) (Other (Comment))   Resp 16   Ht 5\' 2"  (1.575 m)   Wt 138 lb (62.6 kg)   SpO2 98%   BMI 25.24 kg/m   Physical Exam Vitals signs reviewed.  Constitutional:      Appearance: Normal appearance. She is well-developed and normal weight.  HENT:     Head: Normocephalic.     Right Ear: External ear normal.     Left Ear: External ear normal.     Nose: Nose normal.  Eyes:     General: No scleral icterus.    Conjunctiva/sclera: Conjunctivae normal.  Neck:     Musculoskeletal: Normal range of  motion.  Cardiovascular:     Rate and Rhythm: Normal rate and regular rhythm.     Heart sounds: Normal heart sounds. No murmur.  Pulmonary:     Effort: Pulmonary effort is normal. No respiratory distress.     Breath sounds: Normal breath sounds.  Abdominal:     Palpations: Abdomen is soft.  Musculoskeletal: Normal range of motion.  Skin:    General: Skin is warm and dry.  Neurological:     General: No focal deficit present.     Mental Status: She is alert and oriented to person, place, and time.  Psychiatric:        Mood and Affect: Mood normal.        Behavior: Behavior normal.        Thought Content: Thought content normal.        Judgment: Judgment normal.     Activities of Daily Living No flowsheet data found.  Fall Risk Assessment Fall Risk  09/30/2019 04/22/2018 03/21/2017 03/21/2016  Falls in the past year? 0 No No Yes  Number falls in past yr: 0 - - 1  Injury with Fall? 0 - - Yes  Risk for fall due to : History of fall(s) - - -  Follow up Falls evaluation completed - - -     Depression Screen PHQ 2/9 Scores 03/27/2019 04/22/2018 03/21/2017 03/21/2017  PHQ - 2 Score 0 1 1 1   PHQ- 9 Score - - 1 -   Patient declined 6cit screening. 6CIT Screen 03/21/2017  What Year? 0 points  What month? 0 points  What time? 0 points  Count back from 20 0 points  Months in reverse 0 points  Repeat phrase 6 points  Total Score 6        Assessment  & Plan:     Annual Wellness Visit  Reviewed patient's Family Medical History Reviewed and updated list of patient's medical providers Assessment of cognitive impairment was done Assessed patient's functional ability Established a written schedule for health screening [place] Completed and Reviewed  Exercise Activities and Dietary recommendations Goals    . DIET - INCREASE WATER INTAKE     Recommend increasing water intake to 6 glasses a day.     . Increase water intake     Recommend increasing water intake to 4-5 glasses a day.       Immunization History  Administered Date(s) Administered  . Influenza Whole 08/14/2016, 08/19/2017  . Influenza, High Dose Seasonal PF 08/01/2015, 08/23/2018  . Pneumococcal Conjugate-13 10/04/2014  . Pneumococcal Polysaccharide-23 09/22/2005  . Td 02/09/2004  . Zoster 08/09/2008    Health Maintenance  Topic Date Due  . INFLUENZA VACCINE  06/20/2019  . TETANUS/TDAP  11/19/2026 (Originally 02/08/2014)  . DEXA SCAN  Completed  . PNA vac Low Risk Adult  Completed     Discussed health benefits of physical activity, and encouraged her to engage in regular exercise appropriate for her age and condition.    1. Encounter for Medicare annual wellness exam  - TSH - CBC w/Diff/Platelet - Comprehensive Metabolic Panel (CMET) - Lipid panel  2. Need for influenza vaccination  - Flu Vaccine QUAD High Dose(Fluad)  3. Essential (primary) hypertension  - TSH - CBC w/Diff/Platelet - Comprehensive Metabolic Panel (CMET) - Lipid panel  4. Hypercholesteremia  - TSH - CBC w/Diff/Platelet - Comprehensive Metabolic Panel (CMET) - Lipid panel 5.CAD 6.Pacemaker for complete heart block Follow up in one year for Annual Wellness Visit.  Richard Cranford Mon, MD  [facility]

## 2019-09-30 ENCOUNTER — Encounter: Payer: Self-pay | Admitting: Family Medicine

## 2019-09-30 ENCOUNTER — Other Ambulatory Visit: Payer: Self-pay

## 2019-09-30 ENCOUNTER — Ambulatory Visit (INDEPENDENT_AMBULATORY_CARE_PROVIDER_SITE_OTHER): Payer: Medicare Other | Admitting: Family Medicine

## 2019-09-30 ENCOUNTER — Other Ambulatory Visit: Payer: Self-pay | Admitting: Family Medicine

## 2019-09-30 VITALS — BP 144/86 | HR 86 | Temp 97.3°F | Resp 16 | Ht 62.0 in | Wt 138.0 lb

## 2019-09-30 DIAGNOSIS — K219 Gastro-esophageal reflux disease without esophagitis: Secondary | ICD-10-CM

## 2019-09-30 DIAGNOSIS — I10 Essential (primary) hypertension: Secondary | ICD-10-CM | POA: Diagnosis not present

## 2019-09-30 DIAGNOSIS — Z0000 Encounter for general adult medical examination without abnormal findings: Secondary | ICD-10-CM | POA: Diagnosis not present

## 2019-09-30 DIAGNOSIS — Z23 Encounter for immunization: Secondary | ICD-10-CM

## 2019-09-30 DIAGNOSIS — I2510 Atherosclerotic heart disease of native coronary artery without angina pectoris: Secondary | ICD-10-CM | POA: Diagnosis not present

## 2019-09-30 DIAGNOSIS — E7800 Pure hypercholesterolemia, unspecified: Secondary | ICD-10-CM | POA: Diagnosis not present

## 2019-10-01 ENCOUNTER — Telehealth: Payer: Self-pay

## 2019-10-01 LAB — CBC WITH DIFFERENTIAL/PLATELET
Basophils Absolute: 0 10*3/uL (ref 0.0–0.2)
Basos: 1 %
EOS (ABSOLUTE): 0.1 10*3/uL (ref 0.0–0.4)
Eos: 1 %
Hematocrit: 40.3 % (ref 34.0–46.6)
Hemoglobin: 13.3 g/dL (ref 11.1–15.9)
Immature Grans (Abs): 0 10*3/uL (ref 0.0–0.1)
Immature Granulocytes: 0 %
Lymphocytes Absolute: 2.4 10*3/uL (ref 0.7–3.1)
Lymphs: 31 %
MCH: 31.7 pg (ref 26.6–33.0)
MCHC: 33 g/dL (ref 31.5–35.7)
MCV: 96 fL (ref 79–97)
Monocytes Absolute: 0.9 10*3/uL (ref 0.1–0.9)
Monocytes: 11 %
Neutrophils Absolute: 4.4 10*3/uL (ref 1.4–7.0)
Neutrophils: 56 %
Platelets: 272 10*3/uL (ref 150–450)
RBC: 4.19 x10E6/uL (ref 3.77–5.28)
RDW: 12.4 % (ref 11.7–15.4)
WBC: 7.7 10*3/uL (ref 3.4–10.8)

## 2019-10-01 LAB — COMPREHENSIVE METABOLIC PANEL
ALT: 14 IU/L (ref 0–32)
AST: 24 IU/L (ref 0–40)
Albumin/Globulin Ratio: 2.1 (ref 1.2–2.2)
Albumin: 4.6 g/dL (ref 3.6–4.6)
Alkaline Phosphatase: 65 IU/L (ref 39–117)
BUN/Creatinine Ratio: 28 (ref 12–28)
BUN: 13 mg/dL (ref 8–27)
Bilirubin Total: 0.5 mg/dL (ref 0.0–1.2)
CO2: 25 mmol/L (ref 20–29)
Calcium: 9.8 mg/dL (ref 8.7–10.3)
Chloride: 97 mmol/L (ref 96–106)
Creatinine, Ser: 0.47 mg/dL — ABNORMAL LOW (ref 0.57–1.00)
GFR calc Af Amer: 103 mL/min/{1.73_m2} (ref >59)
GFR calc non Af Amer: 89 mL/min/{1.73_m2} (ref >59)
Globulin, Total: 2.2 g/dL (ref 1.5–4.5)
Glucose: 103 mg/dL — ABNORMAL HIGH (ref 65–99)
Potassium: 3.6 mmol/L (ref 3.5–5.2)
Sodium: 140 mmol/L (ref 134–144)
Total Protein: 6.8 g/dL (ref 6.0–8.5)

## 2019-10-01 LAB — LIPID PANEL
Chol/HDL Ratio: 2.3 ratio (ref 0.0–4.4)
Cholesterol, Total: 177 mg/dL (ref 100–199)
HDL: 77 mg/dL (ref >39)
LDL Chol Calc (NIH): 78 mg/dL (ref 0–99)
Triglycerides: 126 mg/dL (ref 0–149)
VLDL Cholesterol Cal: 22 mg/dL (ref 5–40)

## 2019-10-01 LAB — TSH: TSH: 1.13 u[IU]/mL (ref 0.450–4.500)

## 2019-10-01 NOTE — Telephone Encounter (Signed)
-----   Message from Jerrol Banana., MD sent at 10/01/2019  2:10 PM EST ----- Labs good.

## 2019-10-01 NOTE — Telephone Encounter (Signed)
Patient has been advised. KW 

## 2019-10-05 ENCOUNTER — Other Ambulatory Visit: Payer: Self-pay | Admitting: Family Medicine

## 2019-10-05 DIAGNOSIS — K219 Gastro-esophageal reflux disease without esophagitis: Secondary | ICD-10-CM

## 2019-12-08 DIAGNOSIS — I442 Atrioventricular block, complete: Secondary | ICD-10-CM | POA: Diagnosis not present

## 2019-12-17 DIAGNOSIS — I25810 Atherosclerosis of coronary artery bypass graft(s) without angina pectoris: Secondary | ICD-10-CM | POA: Diagnosis not present

## 2019-12-17 DIAGNOSIS — I442 Atrioventricular block, complete: Secondary | ICD-10-CM | POA: Diagnosis not present

## 2019-12-17 DIAGNOSIS — I10 Essential (primary) hypertension: Secondary | ICD-10-CM | POA: Diagnosis not present

## 2019-12-17 DIAGNOSIS — I6523 Occlusion and stenosis of bilateral carotid arteries: Secondary | ICD-10-CM | POA: Diagnosis not present

## 2019-12-29 ENCOUNTER — Other Ambulatory Visit: Payer: Self-pay | Admitting: Family Medicine

## 2020-04-05 DIAGNOSIS — I442 Atrioventricular block, complete: Secondary | ICD-10-CM | POA: Diagnosis not present

## 2020-04-26 ENCOUNTER — Other Ambulatory Visit: Payer: Self-pay

## 2020-04-26 ENCOUNTER — Emergency Department: Payer: Medicare Other

## 2020-04-26 ENCOUNTER — Emergency Department
Admission: EM | Admit: 2020-04-26 | Discharge: 2020-04-27 | Disposition: A | Discharge status: Home / Self Care | Payer: Medicare Other | Attending: Emergency Medicine | Admitting: Emergency Medicine

## 2020-04-26 DIAGNOSIS — M545 Low back pain: Secondary | ICD-10-CM | POA: Insufficient documentation

## 2020-04-26 DIAGNOSIS — T07XXXA Unspecified multiple injuries, initial encounter: Secondary | ICD-10-CM

## 2020-04-26 DIAGNOSIS — I10 Essential (primary) hypertension: Secondary | ICD-10-CM | POA: Diagnosis not present

## 2020-04-26 DIAGNOSIS — I2510 Atherosclerotic heart disease of native coronary artery without angina pectoris: Secondary | ICD-10-CM | POA: Insufficient documentation

## 2020-04-26 DIAGNOSIS — W1830XA Fall on same level, unspecified, initial encounter: Secondary | ICD-10-CM | POA: Insufficient documentation

## 2020-04-26 DIAGNOSIS — Z950 Presence of cardiac pacemaker: Secondary | ICD-10-CM | POA: Diagnosis not present

## 2020-04-26 DIAGNOSIS — S5002XA Contusion of left elbow, initial encounter: Secondary | ICD-10-CM | POA: Insufficient documentation

## 2020-04-26 DIAGNOSIS — Y93E2 Activity, laundry: Secondary | ICD-10-CM | POA: Insufficient documentation

## 2020-04-26 DIAGNOSIS — Z79899 Other long term (current) drug therapy: Secondary | ICD-10-CM | POA: Diagnosis not present

## 2020-04-26 DIAGNOSIS — R52 Pain, unspecified: Secondary | ICD-10-CM | POA: Diagnosis not present

## 2020-04-26 DIAGNOSIS — S4992XA Unspecified injury of left shoulder and upper arm, initial encounter: Secondary | ICD-10-CM | POA: Insufficient documentation

## 2020-04-26 DIAGNOSIS — Y999 Unspecified external cause status: Secondary | ICD-10-CM | POA: Insufficient documentation

## 2020-04-26 DIAGNOSIS — Y92019 Unspecified place in single-family (private) house as the place of occurrence of the external cause: Secondary | ICD-10-CM | POA: Diagnosis not present

## 2020-04-26 DIAGNOSIS — W19XXXA Unspecified fall, initial encounter: Secondary | ICD-10-CM | POA: Diagnosis not present

## 2020-04-26 DIAGNOSIS — S0990XA Unspecified injury of head, initial encounter: Secondary | ICD-10-CM | POA: Insufficient documentation

## 2020-04-26 DIAGNOSIS — M7989 Other specified soft tissue disorders: Secondary | ICD-10-CM | POA: Diagnosis not present

## 2020-04-26 DIAGNOSIS — S40012A Contusion of left shoulder, initial encounter: Secondary | ICD-10-CM | POA: Diagnosis not present

## 2020-04-26 DIAGNOSIS — R0902 Hypoxemia: Secondary | ICD-10-CM | POA: Diagnosis not present

## 2020-04-26 DIAGNOSIS — S3993XA Unspecified injury of pelvis, initial encounter: Secondary | ICD-10-CM | POA: Diagnosis not present

## 2020-04-26 MED ORDER — OXYCODONE-ACETAMINOPHEN 5-325 MG PO TABS
1.0000 | ORAL_TABLET | Freq: Once | ORAL | Status: AC
  Administered 2020-04-26: 1 via ORAL
  Filled 2020-04-26: qty 1

## 2020-04-26 NOTE — ED Triage Notes (Signed)
Pt to ED from home for chief complaint of fall. Lost balance while doing laundry. C/o pain in left shoulder/elbow and lower back.  Takes aspirin.  Unsure if hit head or LOC.  Alert and oriented x4.

## 2020-04-26 NOTE — ED Notes (Signed)
See triage note, fall, pain to left shoulder/elbow and lower back

## 2020-04-27 ENCOUNTER — Emergency Department: Payer: Medicare Other

## 2020-04-27 DIAGNOSIS — S4992XA Unspecified injury of left shoulder and upper arm, initial encounter: Secondary | ICD-10-CM | POA: Diagnosis not present

## 2020-04-27 MED ORDER — OXYCODONE-ACETAMINOPHEN 5-325 MG PO TABS
1.0000 | ORAL_TABLET | Freq: Once | ORAL | Status: AC
  Administered 2020-04-27: 1 via ORAL
  Filled 2020-04-27: qty 1

## 2020-04-27 MED ORDER — OXYCODONE-ACETAMINOPHEN 5-325 MG PO TABS: 1.0000 | ORAL_TABLET | Freq: Four times a day (QID) | ORAL | 0 refills | Status: DC | PRN

## 2020-04-27 NOTE — ED Provider Notes (Signed)
[facility] Emergency Department Provider Note  ____________________________________________  Time seen: Approximately 12:23 AM  I have reviewed the triage vital signs and the nursing notes.   HISTORY  Chief Complaint Fall    HPI Melanie Franklin is a 86 y.o. female who presents the emergency department status post a fall.  Patient states that she was at home, attempting to take clothes out of the dryer when she lost her balance falling backwards.  Patient believes she hit her head but she is unsure whether she lost consciousness or not.  Patient states that she could not get herself out of the floor and had pressed her life alert button.  Eventually help arrived and EMS brought the patient to the emergency department.  Her primary complaint is left shoulder and arm pain.  She does have a history of significant fracture to the left shoulder 5 years ago.  Patient did not have surgical repair given the extensive fracture and concern of her comorbidities and the risk of surgery.  Patient states that she is having pain, swelling to the left arm.  No headache, no visual changes, no hip pain.  Patient is complaining of lower back pain however.  No other         Past Medical History:  Diagnosis Date  . Coronary artery disease   . Family history of adverse reaction to anesthesia    "sister had a reaction to meds given during a heart cath that almost killed her."  . GERD (gastroesophageal reflux disease)   . Hypertension   . Presence of permanent cardiac pacemaker     Patient Active Problem List   Diagnosis Date Noted  . Memory loss or impairment 07/11/2015  . Arthritis 05/18/2015  . Arthropathy of hand 05/18/2015  . CAD in native artery 05/18/2015  . Carpal tunnel syndrome 05/18/2015  . DDD (degenerative disc disease), cervical 05/18/2015  . Clinical depression 05/18/2015  . Gastro-esophageal reflux disease without esophagitis 05/18/2015  . Essential (primary)  hypertension 05/18/2015  . Adaptive colitis 05/18/2015  . Hypercholesteremia 05/18/2015  . 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 Fluttering heart 05/18/2015  . Restless leg 05/18/2015  . Apnea, sleep 05/18/2015  . Umbilical hernia without obstruction and without gangrene 05/18/2015  . TI (tricuspid incompetence) 05/04/2015  . Obstructive apnea 09/30/2014  . MI (mitral incompetence) 09/30/2014  . Artificial cardiac pacemaker 01/27/2014  . Acquired complete AV block ([place]) 01/27/2014    Past Surgical History:  Procedure Laterality Date  . CARDIAC CATHETERIZATION    . CATARACT EXTRACTION Bilateral   . CORONARY ARTERY BYPASS GRAFT  03/09/2009   x [facility]  . ESOPHAGEAL DILATION    . KNEE ARTHROSCOPY Left   . LASIK    . PACEMAKER INSERTION    . PACEMAKER INSERTION Left 07/12/2017   Procedure: PACEMAKER CHANGE OUT;  Surgeon: Marzetta Board, MD;  Location: [facility] ORS;  Service: Cardiovascular;  Laterality: Left;  . TONSILLECTOMY      Prior to Admission medications   Medication Sig Start Date End Date Taking? Authorizing Provider  acetaminophen (TYLENOL) 650 MG CR tablet Take 650 mg by mouth 2 (two) times daily. 1 tablet in the morning and 1 at bedtime    [provider]  amLODipine (NORVASC) 5 MG tablet TAKE 1 TABLET (5 MG TOTAL) BY MOUTH DAILY. 09/30/19   Jerrol Banana., MD  aspirin 81 MG tablet Take 81 mg by mouth daily.  09/18/11   [provider]  hydrochlorothiazide (HYDRODIURIL) 25 MG tablet TAKE ONE TABLET BY MOUTH DAILY 12/29/19   Jerrol Banana., MD  magnesium oxide (MAG-OX) 400 MG tablet Take 400 mg by mouth daily.  09/18/11   [provider]  MULTIPLE VITAMINS PO Take 1 tablet by mouth daily.  09/18/11   [provider]  Multiple Vitamins-Minerals (ONE DAILY CALCIUM/IRON PO) Take 1 tablet by mouth daily.    [provider]  omeprazole (PRILOSEC) 20 MG capsule TAKE 1 CAPSULE (20 MG TOTAL) BY  MOUTH DAILY. 09/30/19   Jerrol Banana., MD  OVER THE COUNTER MEDICATION Apply 1 application topically as needed (muscle and joint  pain). Horse Liniment Topical Analgesic 4% Natural Menthol    [provider]  oxyCODONE-acetaminophen (PERCOCET/ROXICET) 5-325 MG tablet Take 1 tablet by mouth every 6 (six) hours as needed for severe pain. 04/27/20   Epifania Littrell, Charline Bills, PA-C  pravastatin (PRAVACHOL) 40 MG tablet TAKE ONE TABLET BY MOUTH DAILY 08/18/19   Jerrol Banana., MD  rOPINIRole (REQUIP) 1 MG tablet Take 1 tablet (1 mg total) by mouth at bedtime. 10/01/16   Jerrol Banana., MD    Allergies Ciprofloxacin, Meloxicam, Tramadol, Amoxicillin, and Entex lq  [phenylephrine-guaifenesin]  Family History  Problem Relation Age of Onset  . Hypertension Mother   . Colon cancer Mother   . Cerebrovascular Accident Mother   . Ulcers Mother   . Other Mother        nervous breakdown  . Stroke Father   . Congestive Heart Failure Sister   . Kidney Stones Sister   . Cancer Sister        oral  . Hypertension Brother   . Hypertension Sister   . Diabetes Sister   . CAD Sister   . Other Sister   . Breast cancer Neg Hx     Social History Social History   Tobacco Use  . Smoking status: Never Smoker  . Smokeless tobacco: Never Used  Substance Use Topics  . Alcohol use: No  . Drug use: No     Review of Systems  Constitutional: No fever/chills Eyes: No visual changes. No discharge ENT: No upper respiratory complaints. Cardiovascular: no chest pain. Respiratory: no cough. No SOB. Gastrointestinal: No abdominal pain.  No nausea, no vomiting.  No diarrhea.  No constipation. Musculoskeletal: L arm pain, low back pain Skin: Negative for rash, abrasions, lacerations, ecchymosis. Neurological: Negative for headaches, focal weakness or numbness. 10-point ROS otherwise negative.  ____________________________________________   PHYSICAL EXAM:  VITAL SIGNS: ED  Triage Vitals  Enc Vitals Group     BP 04/26/20 2132 (!) 155/63     Pulse Rate 04/26/20 2128 84     Resp 04/26/20 2128 18     Temp 04/26/20 2128 98.5 F (36.9 C)     Temp Source 04/26/20 2128 Oral     SpO2 04/26/20 2132 95 %     Weight 04/26/20 2128 135 lb (61.2 kg)     Height 04/26/20 2128 5\' 3"  (1.6 m)     Head Circumference --      Peak Flow --      Pain Score 04/26/20 2128 9     Pain Loc --      Pain Edu? --      Excl. in [place]? --      Constitutional: Alert and oriented. Well appearing and in no acute distress. Eyes: Conjunctivae are normal. PERRL. EOMI. Head: Atraumatic. ENT:  Ears:       Nose: No congestion/rhinnorhea.      Mouth/Throat: Mucous membranes are moist.  Neck: No stridor.  No cervical spine tenderness to palpation.  Cardiovascular: Normal rate, regular rhythm. Normal S1 and S2.  Good peripheral circulation. Respiratory: Normal respiratory effort without tachypnea or retractions. Lungs CTAB. Good air entry to the bases with no decreased or absent breath sounds. Musculoskeletal: Full range of motion to all extremities. No gross deformities appreciated.  Visualization of the patient's left arm reveals no gross deformity.  Limited range of motion due to pain.  Patient has significant ecchymosis and edema about the left elbow.  When compared with the right side, left elbow is approximately 2-1/2 times it size.  There is no evidence of trauma to the forearm.  Patient is very tender to palpation starting at the shoulder extending all the way through the humerus.  No palpable abnormalities are appreciated.  Radial pulse and sensation intact distally.  Examination of the lumbar spine, bilateral hips reveals mild tenderness in the lumbar region diffusely without point specific tenderness or palpable abnormality.  No tenderness over bilateral hips.  Dorsalis pedis pulses sensation intact bilateral lower extremities. Neurologic:  Normal speech and language. No gross focal  neurologic deficits are appreciated.  Cranial nerves II through XII grossly intact. Skin:  Skin is warm, dry and intact. No rash noted. Psychiatric: Mood and affect are normal. Speech and behavior are normal. Patient exhibits appropriate insight and judgement.   ____________________________________________   LABS (all labs ordered are listed, but only abnormal results are displayed)  Labs Reviewed - No data to display ____________________________________________  EKG   ____________________________________________  RADIOLOGY I personally viewed and evaluated these images as part of my medical decision making, as well as reviewing the written report by the radiologist.  DG Pelvis 1-2 Views  Result Date: 04/26/2020 CLINICAL DATA:  Status post fall. EXAM: PELVIS - 1-2 VIEW COMPARISON:  None. FINDINGS: There is no evidence of pelvic fracture or diastasis. Moderate severity degenerative changes seen involving both hips and the visualized portion of the lower lumbar spine. No pelvic bone lesions are seen. IMPRESSION: Degenerative changes without evidence of an acute osseous abnormality. Electronically Signed   By: Virgina Norfolk M.D.   On: 04/26/2020 22:53   CT Head Wo Contrast  Result Date: 04/26/2020 CLINICAL DATA:  [place], left shoulder and elbow pain, lower back pain EXAM: CT HEAD WITHOUT CONTRAST CT CERVICAL SPINE WITHOUT CONTRAST TECHNIQUE: Multidetector CT imaging of the head and cervical spine was performed following the standard protocol without intravenous contrast. Multiplanar CT image reconstructions of the cervical spine were also generated. COMPARISON:  None. FINDINGS: CT HEAD FINDINGS Brain: Chronic small vessel ischemic changes are seen within the bilateral basal ganglia and periventricular white matter. No signs of acute infarct or hemorrhage. Lateral ventricles and remaining midline structures are unremarkable. No acute extra-axial fluid collections. No mass effect. Vascular: No  hyperdense vessel or unexpected calcification. Skull: Normal. Negative for fracture or focal lesion. Sinuses/Orbits: No acute finding. Other: None. CT CERVICAL SPINE FINDINGS Alignment: Alignment is anatomic. Skull base and vertebrae: No acute displaced fracture. Soft tissues and spinal canal: No prevertebral fluid or swelling. No visible canal hematoma. Disc levels: There is multilevel cervical spondylosis with disc space narrowing and osteophyte formation from C3 through C7, greatest at the C5-6 and C6-7 levels. There is diffuse facet hypertrophy, with bony fusion across the right C2/C3 facet and left C4/C5 facet. There is significant bilateral neural foraminal  narrowing from C3/C4 through C6/C7. Upper chest: Airway is patent. Biapical pleural and parenchymal scarring is noted. Other: Reconstructed images demonstrate no additional findings. IMPRESSION: 1. Chronic small vessel ischemic changes.  No acute process. 2. Multilevel cervical spondylosis, no acute cervical spine fracture. Electronically Signed   By: Randa Ngo M.D.   On: 04/26/2020 22:23   CT Cervical Spine Wo Contrast  Result Date: 04/26/2020 CLINICAL DATA:  [place], left shoulder and elbow pain, lower back pain EXAM: CT HEAD WITHOUT CONTRAST CT CERVICAL SPINE WITHOUT CONTRAST TECHNIQUE: Multidetector CT imaging of the head and cervical spine was performed following the standard protocol without intravenous contrast. Multiplanar CT image reconstructions of the cervical spine were also generated. COMPARISON:  None. FINDINGS: CT HEAD FINDINGS Brain: Chronic small vessel ischemic changes are seen within the bilateral basal ganglia and periventricular white matter. No signs of acute infarct or hemorrhage. Lateral ventricles and remaining midline structures are unremarkable. No acute extra-axial fluid collections. No mass effect. Vascular: No hyperdense vessel or unexpected calcification. Skull: Normal. Negative for fracture or focal lesion.  Sinuses/Orbits: No acute finding. Other: None. CT CERVICAL SPINE FINDINGS Alignment: Alignment is anatomic. Skull base and vertebrae: No acute displaced fracture. Soft tissues and spinal canal: No prevertebral fluid or swelling. No visible canal hematoma. Disc levels: There is multilevel cervical spondylosis with disc space narrowing and osteophyte formation from C3 through C7, greatest at the C5-6 and C6-7 levels. There is diffuse facet hypertrophy, with bony fusion across the right C2/C3 facet and left C4/C5 facet. There is significant bilateral neural foraminal narrowing from C3/C4 through C6/C7. Upper chest: Airway is patent. Biapical pleural and parenchymal scarring is noted. Other: Reconstructed images demonstrate no additional findings. IMPRESSION: 1. Chronic small vessel ischemic changes.  No acute process. 2. Multilevel cervical spondylosis, no acute cervical spine fracture. Electronically Signed   By: Randa Ngo M.D.   On: 04/26/2020 22:23   DG Humerus Left  Result Date: 04/26/2020 CLINICAL DATA:  Status post fall. EXAM: LEFT HUMERUS - 2+ VIEW COMPARISON:  September 21, 2015 FINDINGS: A chronic, partially impacted fracture is seen involving the head and neck of the proximal left humerus. An additional fracture of indeterminate age is seen along the medial aspect of the proximal left humerus, within the expected region of the surgical neck. This is not clearly identified on the prior study. Marked severity soft tissue swelling is seen involving the visualized portion of the elbow. This is most prominent along the dorsal aspect of the proximal left ulna. IMPRESSION: 1. Chronic, partially impacted fracture involving the head and neck of the proximal left humerus. 2. Additional suspected fracture of the proximal left humerus of indeterminate age. CT correlation is recommended. 3. Marked severity soft tissue swelling along the dorsal aspect of the left elbow and proximal left forearm. Electronically Signed    By: Virgina Norfolk M.D.   On: 04/26/2020 22:58    ____________________________________________    PROCEDURES  Procedure(s) performed:    Procedures    Medications  oxyCODONE-acetaminophen (PERCOCET/ROXICET) 5-325 MG per tablet 1 tablet (has no administration in time range)  oxyCODONE-acetaminophen (PERCOCET/ROXICET) 5-325 MG per tablet 1 tablet (1 tablet Oral Given 04/26/20 2245)     ____________________________________________   INITIAL IMPRESSION / ASSESSMENT AND PLAN / ED COURSE  Pertinent labs & imaging results that were available during my care of the patient were reviewed by me and considered in my medical decision making (see chart for details).  Review of the [place] CSRS was performed in  accordance of the [place] prior to dispensing any controlled drugs.           Patient's diagnosis is consistent with fall, multiple contusions, shoulder injury.  Patient presented to the emergency department complaining primarily of left shoulder pain/arm pain after a fall.  Patient fell while leaning over trying to take close out of the dryer.  Patient did hit her head but was unsure whether she lost consciousness or not.  Majority work-up was reassuring.  There was evidence of chronic fracture of the left shoulder with a possible underlying fracture.  CT was recommended which was performed.  At this time, CT had not returned but this would not change patient's management.  I do not see any extensive fracture requiring urgent surgical intervention on the CT scan.  Patient will follow up with orthopedics for further management.  Patient will have sling immobilizer.  Pain medication..  Again follow-up with orthopedics.  Patient is given ED precautions to return to the ED for any worsening or new symptoms.     ____________________________________________  FINAL CLINICAL IMPRESSION(S) / ED DIAGNOSES  Final diagnoses:  Fall, initial encounter  Injury of left shoulder, initial encounter   Multiple contusions      NEW MEDICATIONS STARTED DURING THIS VISIT:  ED Discharge Orders         Ordered    oxyCODONE-acetaminophen (PERCOCET/ROXICET) 5-325 MG tablet  Every 6 hours PRN     04/27/20 0121              This chart was dictated using voice recognition software/Dragon. Despite best efforts to proofread, errors can occur which can change the meaning. Any change was purely unintentional.    Darletta Moll, PA-C 04/27/20 0123    Harvest Dark, MD 04/29/20 1442

## 2020-05-03 DIAGNOSIS — S42295A Other nondisplaced fracture of upper end of left humerus, initial encounter for closed fracture: Secondary | ICD-10-CM | POA: Diagnosis not present

## 2020-06-02 DIAGNOSIS — E7800 Pure hypercholesterolemia, unspecified: Secondary | ICD-10-CM | POA: Diagnosis not present

## 2020-06-02 DIAGNOSIS — S42292D Other displaced fracture of upper end of left humerus, subsequent encounter for fracture with routine healing: Secondary | ICD-10-CM | POA: Diagnosis not present

## 2020-06-02 DIAGNOSIS — H269 Unspecified cataract: Secondary | ICD-10-CM | POA: Diagnosis not present

## 2020-06-02 DIAGNOSIS — Z7982 Long term (current) use of aspirin: Secondary | ICD-10-CM | POA: Diagnosis not present

## 2020-06-02 DIAGNOSIS — H409 Unspecified glaucoma: Secondary | ICD-10-CM | POA: Diagnosis not present

## 2020-06-02 DIAGNOSIS — I10 Essential (primary) hypertension: Secondary | ICD-10-CM | POA: Diagnosis not present

## 2020-06-02 DIAGNOSIS — Z950 Presence of cardiac pacemaker: Secondary | ICD-10-CM | POA: Diagnosis not present

## 2020-06-02 DIAGNOSIS — Z9181 History of falling: Secondary | ICD-10-CM | POA: Diagnosis not present

## 2020-06-02 DIAGNOSIS — G4730 Sleep apnea, unspecified: Secondary | ICD-10-CM | POA: Diagnosis not present

## 2020-06-06 DIAGNOSIS — H269 Unspecified cataract: Secondary | ICD-10-CM | POA: Diagnosis not present

## 2020-06-06 DIAGNOSIS — S42292D Other displaced fracture of upper end of left humerus, subsequent encounter for fracture with routine healing: Secondary | ICD-10-CM | POA: Diagnosis not present

## 2020-06-06 DIAGNOSIS — H409 Unspecified glaucoma: Secondary | ICD-10-CM | POA: Diagnosis not present

## 2020-06-06 DIAGNOSIS — E7800 Pure hypercholesterolemia, unspecified: Secondary | ICD-10-CM | POA: Diagnosis not present

## 2020-06-06 DIAGNOSIS — G4730 Sleep apnea, unspecified: Secondary | ICD-10-CM | POA: Diagnosis not present

## 2020-06-06 DIAGNOSIS — I10 Essential (primary) hypertension: Secondary | ICD-10-CM | POA: Diagnosis not present

## 2020-06-07 DIAGNOSIS — H269 Unspecified cataract: Secondary | ICD-10-CM | POA: Diagnosis not present

## 2020-06-07 DIAGNOSIS — E7800 Pure hypercholesterolemia, unspecified: Secondary | ICD-10-CM | POA: Diagnosis not present

## 2020-06-07 DIAGNOSIS — G4730 Sleep apnea, unspecified: Secondary | ICD-10-CM | POA: Diagnosis not present

## 2020-06-07 DIAGNOSIS — I10 Essential (primary) hypertension: Secondary | ICD-10-CM | POA: Diagnosis not present

## 2020-06-07 DIAGNOSIS — S42292D Other displaced fracture of upper end of left humerus, subsequent encounter for fracture with routine healing: Secondary | ICD-10-CM | POA: Diagnosis not present

## 2020-06-07 DIAGNOSIS — H409 Unspecified glaucoma: Secondary | ICD-10-CM | POA: Diagnosis not present

## 2020-06-08 DIAGNOSIS — H269 Unspecified cataract: Secondary | ICD-10-CM | POA: Diagnosis not present

## 2020-06-08 DIAGNOSIS — S42292D Other displaced fracture of upper end of left humerus, subsequent encounter for fracture with routine healing: Secondary | ICD-10-CM | POA: Diagnosis not present

## 2020-06-08 DIAGNOSIS — H409 Unspecified glaucoma: Secondary | ICD-10-CM | POA: Diagnosis not present

## 2020-06-08 DIAGNOSIS — E7800 Pure hypercholesterolemia, unspecified: Secondary | ICD-10-CM | POA: Diagnosis not present

## 2020-06-08 DIAGNOSIS — G4730 Sleep apnea, unspecified: Secondary | ICD-10-CM | POA: Diagnosis not present

## 2020-06-08 DIAGNOSIS — I10 Essential (primary) hypertension: Secondary | ICD-10-CM | POA: Diagnosis not present

## 2020-06-10 DIAGNOSIS — H409 Unspecified glaucoma: Secondary | ICD-10-CM | POA: Diagnosis not present

## 2020-06-10 DIAGNOSIS — I10 Essential (primary) hypertension: Secondary | ICD-10-CM | POA: Diagnosis not present

## 2020-06-10 DIAGNOSIS — H269 Unspecified cataract: Secondary | ICD-10-CM | POA: Diagnosis not present

## 2020-06-10 DIAGNOSIS — S42292D Other displaced fracture of upper end of left humerus, subsequent encounter for fracture with routine healing: Secondary | ICD-10-CM | POA: Diagnosis not present

## 2020-06-10 DIAGNOSIS — E7800 Pure hypercholesterolemia, unspecified: Secondary | ICD-10-CM | POA: Diagnosis not present

## 2020-06-10 DIAGNOSIS — G4730 Sleep apnea, unspecified: Secondary | ICD-10-CM | POA: Diagnosis not present

## 2020-06-13 DIAGNOSIS — E7800 Pure hypercholesterolemia, unspecified: Secondary | ICD-10-CM | POA: Diagnosis not present

## 2020-06-13 DIAGNOSIS — G4730 Sleep apnea, unspecified: Secondary | ICD-10-CM | POA: Diagnosis not present

## 2020-06-13 DIAGNOSIS — H409 Unspecified glaucoma: Secondary | ICD-10-CM | POA: Diagnosis not present

## 2020-06-13 DIAGNOSIS — H269 Unspecified cataract: Secondary | ICD-10-CM | POA: Diagnosis not present

## 2020-06-13 DIAGNOSIS — S42292D Other displaced fracture of upper end of left humerus, subsequent encounter for fracture with routine healing: Secondary | ICD-10-CM | POA: Diagnosis not present

## 2020-06-13 DIAGNOSIS — I10 Essential (primary) hypertension: Secondary | ICD-10-CM | POA: Diagnosis not present

## 2020-06-14 DIAGNOSIS — S42292D Other displaced fracture of upper end of left humerus, subsequent encounter for fracture with routine healing: Secondary | ICD-10-CM | POA: Diagnosis not present

## 2020-06-14 DIAGNOSIS — I10 Essential (primary) hypertension: Secondary | ICD-10-CM | POA: Diagnosis not present

## 2020-06-14 DIAGNOSIS — G4730 Sleep apnea, unspecified: Secondary | ICD-10-CM | POA: Diagnosis not present

## 2020-06-14 DIAGNOSIS — H269 Unspecified cataract: Secondary | ICD-10-CM | POA: Diagnosis not present

## 2020-06-14 DIAGNOSIS — H409 Unspecified glaucoma: Secondary | ICD-10-CM | POA: Diagnosis not present

## 2020-06-14 DIAGNOSIS — E7800 Pure hypercholesterolemia, unspecified: Secondary | ICD-10-CM | POA: Diagnosis not present

## 2020-06-15 DIAGNOSIS — S42292D Other displaced fracture of upper end of left humerus, subsequent encounter for fracture with routine healing: Secondary | ICD-10-CM | POA: Diagnosis not present

## 2020-06-15 DIAGNOSIS — H269 Unspecified cataract: Secondary | ICD-10-CM | POA: Diagnosis not present

## 2020-06-15 DIAGNOSIS — E7800 Pure hypercholesterolemia, unspecified: Secondary | ICD-10-CM | POA: Diagnosis not present

## 2020-06-15 DIAGNOSIS — I10 Essential (primary) hypertension: Secondary | ICD-10-CM | POA: Diagnosis not present

## 2020-06-15 DIAGNOSIS — G4730 Sleep apnea, unspecified: Secondary | ICD-10-CM | POA: Diagnosis not present

## 2020-06-15 DIAGNOSIS — H409 Unspecified glaucoma: Secondary | ICD-10-CM | POA: Diagnosis not present

## 2020-06-16 DIAGNOSIS — H409 Unspecified glaucoma: Secondary | ICD-10-CM | POA: Diagnosis not present

## 2020-06-16 DIAGNOSIS — S42292D Other displaced fracture of upper end of left humerus, subsequent encounter for fracture with routine healing: Secondary | ICD-10-CM | POA: Diagnosis not present

## 2020-06-16 DIAGNOSIS — E7800 Pure hypercholesterolemia, unspecified: Secondary | ICD-10-CM | POA: Diagnosis not present

## 2020-06-16 DIAGNOSIS — H269 Unspecified cataract: Secondary | ICD-10-CM | POA: Diagnosis not present

## 2020-06-16 DIAGNOSIS — G4730 Sleep apnea, unspecified: Secondary | ICD-10-CM | POA: Diagnosis not present

## 2020-06-16 DIAGNOSIS — I10 Essential (primary) hypertension: Secondary | ICD-10-CM | POA: Diagnosis not present

## 2020-06-21 DIAGNOSIS — S42292D Other displaced fracture of upper end of left humerus, subsequent encounter for fracture with routine healing: Secondary | ICD-10-CM | POA: Diagnosis not present

## 2020-06-21 DIAGNOSIS — H269 Unspecified cataract: Secondary | ICD-10-CM | POA: Diagnosis not present

## 2020-06-21 DIAGNOSIS — E7800 Pure hypercholesterolemia, unspecified: Secondary | ICD-10-CM | POA: Diagnosis not present

## 2020-06-21 DIAGNOSIS — I10 Essential (primary) hypertension: Secondary | ICD-10-CM | POA: Diagnosis not present

## 2020-06-21 DIAGNOSIS — H409 Unspecified glaucoma: Secondary | ICD-10-CM | POA: Diagnosis not present

## 2020-06-21 DIAGNOSIS — G4730 Sleep apnea, unspecified: Secondary | ICD-10-CM | POA: Diagnosis not present

## 2020-06-23 DIAGNOSIS — H269 Unspecified cataract: Secondary | ICD-10-CM | POA: Diagnosis not present

## 2020-06-23 DIAGNOSIS — E7800 Pure hypercholesterolemia, unspecified: Secondary | ICD-10-CM | POA: Diagnosis not present

## 2020-06-23 DIAGNOSIS — G4730 Sleep apnea, unspecified: Secondary | ICD-10-CM | POA: Diagnosis not present

## 2020-06-23 DIAGNOSIS — S42292D Other displaced fracture of upper end of left humerus, subsequent encounter for fracture with routine healing: Secondary | ICD-10-CM | POA: Diagnosis not present

## 2020-06-23 DIAGNOSIS — I10 Essential (primary) hypertension: Secondary | ICD-10-CM | POA: Diagnosis not present

## 2020-06-23 DIAGNOSIS — H409 Unspecified glaucoma: Secondary | ICD-10-CM | POA: Diagnosis not present

## 2020-06-26 ENCOUNTER — Other Ambulatory Visit: Payer: Self-pay | Admitting: Family Medicine

## 2020-06-26 DIAGNOSIS — K219 Gastro-esophageal reflux disease without esophagitis: Secondary | ICD-10-CM

## 2020-06-26 NOTE — Telephone Encounter (Signed)
90 day courtesy RF given must keep upcoming appt 10/04/20 Requested Prescriptions  Pending Prescriptions Disp Refills  . omeprazole (PRILOSEC) 20 MG capsule [Pharmacy Med Name: OMEPRAZOLE DR 20 MG CAPSULE] 90 capsule 0    Sig: TAKE ONE CAPSULE BY MOUTH DAILY     Gastroenterology: Proton Pump Inhibitors Failed - 06/26/2020  6:01 PM      Failed - Valid encounter within last 12 months    Recent Outpatient Visits          9 months ago Encounter for [employer] annual wellness exam   [facility] Jerrol Banana., MD   1 year ago CAD in native artery   [facility] Jerrol Banana., MD   1 year ago Hoarseness   [facility] Jerrol Banana., [facility]   2 years ago Essential (primary) hypertension   [facility] Jerrol Banana., MD   2 years ago Essential (primary) hypertension   [facility] Jerrol Banana., MD             . amLODipine ([place]) 5 MG tablet [Pharmacy Med Name: amLODIPine BESYLATE 5 MG TAB] 90 tablet 0    Sig: TAKE ONE TABLET BY MOUTH DAILY     Cardiovascular:  Calcium Channel Blockers Failed - 06/26/2020  6:01 PM      Failed - Last BP in normal range    BP Readings from Last 1 Encounters:  04/27/20 (!) 156/78         Failed - Valid encounter within last 6 months    Recent Outpatient Visits          9 months ago Encounter for [employer] annual wellness exam   [facility] Jerrol Banana., MD   1 year ago CAD in native artery   [facility] Jerrol Banana., MD   1 year ago Hoarseness   [facility] Jerrol Banana., MD   2 years ago Essential (primary) hypertension   [facility] Jerrol Banana., MD   2 years ago Essential (primary) hypertension   [facility] Jerrol Banana., MD

## 2020-07-06 DIAGNOSIS — M419 Scoliosis, unspecified: Secondary | ICD-10-CM | POA: Diagnosis not present

## 2020-07-06 DIAGNOSIS — M47896 Other spondylosis, lumbar region: Secondary | ICD-10-CM | POA: Diagnosis not present

## 2020-07-06 DIAGNOSIS — S42292A Other displaced fracture of upper end of left humerus, initial encounter for closed fracture: Secondary | ICD-10-CM | POA: Diagnosis not present

## 2020-07-14 DIAGNOSIS — I25810 Atherosclerosis of coronary artery bypass graft(s) without angina pectoris: Secondary | ICD-10-CM | POA: Diagnosis not present

## 2020-07-14 DIAGNOSIS — I10 Essential (primary) hypertension: Secondary | ICD-10-CM | POA: Diagnosis not present

## 2020-07-14 DIAGNOSIS — I442 Atrioventricular block, complete: Secondary | ICD-10-CM | POA: Diagnosis not present

## 2020-07-14 DIAGNOSIS — G4733 Obstructive sleep apnea (adult) (pediatric): Secondary | ICD-10-CM | POA: Diagnosis not present

## 2020-07-14 DIAGNOSIS — I6523 Occlusion and stenosis of bilateral carotid arteries: Secondary | ICD-10-CM | POA: Diagnosis not present

## 2020-08-02 DIAGNOSIS — I442 Atrioventricular block, complete: Secondary | ICD-10-CM | POA: Diagnosis not present

## 2020-08-31 DIAGNOSIS — D2262 Melanocytic nevi of left upper limb, including shoulder: Secondary | ICD-10-CM | POA: Diagnosis not present

## 2020-08-31 DIAGNOSIS — L821 Other seborrheic keratosis: Secondary | ICD-10-CM | POA: Diagnosis not present

## 2020-08-31 DIAGNOSIS — D2271 Melanocytic nevi of right lower limb, including hip: Secondary | ICD-10-CM | POA: Diagnosis not present

## 2020-08-31 DIAGNOSIS — D225 Melanocytic nevi of trunk: Secondary | ICD-10-CM | POA: Diagnosis not present

## 2020-08-31 DIAGNOSIS — D2272 Melanocytic nevi of left lower limb, including hip: Secondary | ICD-10-CM | POA: Diagnosis not present

## 2020-08-31 DIAGNOSIS — D2261 Melanocytic nevi of right upper limb, including shoulder: Secondary | ICD-10-CM | POA: Diagnosis not present

## 2020-09-13 ENCOUNTER — Other Ambulatory Visit: Payer: Self-pay

## 2020-09-13 ENCOUNTER — Ambulatory Visit (INDEPENDENT_AMBULATORY_CARE_PROVIDER_SITE_OTHER): Payer: Medicare Other | Admitting: Family Medicine

## 2020-09-13 DIAGNOSIS — I25810 Atherosclerosis of coronary artery bypass graft(s) without angina pectoris: Secondary | ICD-10-CM | POA: Diagnosis not present

## 2020-09-13 DIAGNOSIS — Z23 Encounter for immunization: Secondary | ICD-10-CM

## 2020-09-13 DIAGNOSIS — I6523 Occlusion and stenosis of bilateral carotid arteries: Secondary | ICD-10-CM | POA: Diagnosis not present

## 2020-09-13 DIAGNOSIS — I10 Essential (primary) hypertension: Secondary | ICD-10-CM | POA: Diagnosis not present

## 2020-09-13 DIAGNOSIS — I442 Atrioventricular block, complete: Secondary | ICD-10-CM | POA: Diagnosis not present

## 2020-09-24 ENCOUNTER — Other Ambulatory Visit: Payer: Self-pay | Admitting: Family Medicine

## 2020-09-24 DIAGNOSIS — K219 Gastro-esophageal reflux disease without esophagitis: Secondary | ICD-10-CM

## 2020-09-24 NOTE — Telephone Encounter (Signed)
Requested Prescriptions  Pending Prescriptions Disp Refills  . amLODipine (NORVASC) 5 MG tablet [Pharmacy Med Name: amLODIPine BESYLATE 5 MG TAB] 10 tablet 0    Sig: TAKE ONE TABLET BY MOUTH DAILY     Cardiovascular:  Calcium Channel Blockers Failed - 09/24/2020  9:55 AM      Failed - Last BP in normal range    BP Readings from Last 1 Encounters:  04/27/20 (!) 156/78         Failed - Valid encounter within last 6 months    Recent Outpatient Visits          12 months ago Encounter for [employer] annual wellness exam   [employer] [facility] Jerrol Banana., MD   1 year ago CAD in native artery   [facility] Jerrol Banana., MD   2 years ago Hoarseness   [facility] Jerrol Banana., MD   2 years ago Essential (primary) hypertension   [facility] Jerrol Banana., MD   2 years ago Essential (primary) hypertension   [facility] Jerrol Banana., MD             . pravastatin (PRAVACHOL) 40 MG tablet [Pharmacy Med Name: PRAVASTATIN SODIUM 40 MG TAB] 90 tablet 4    Sig: TAKE ONE TABLET BY MOUTH DAILY     Cardiovascular:  Antilipid - Statins Failed - 09/24/2020  9:55 AM      Failed - LDL in normal range and within 360 days    LDL Chol Calc (NIH)  Date Value Ref Range Status  09/30/2019 78 0 - 99 mg/dL Final         Failed - Valid encounter within last 12 months    Recent Outpatient Visits          12 months ago Encounter for [employer] annual wellness exam   [facility] Jerrol Banana., MD   1 year ago CAD in native artery   [facility] Jerrol Banana., MD   2 years ago Hoarseness   [facility] Jerrol Banana., MD   2 years ago Essential (primary) hypertension   [facility] Jerrol Banana., MD   2 years ago Essential (primary) hypertension   [facility] Jerrol Banana., MD              Passed - Total Cholesterol in normal range and within 360 days    Cholesterol, Total  Date Value Ref Range Status  09/30/2019 177 100 - 199 mg/dL Final         Passed - HDL in normal range and within 360 days    HDL  Date Value Ref Range Status  09/30/2019 77 >39 mg/dL Final         Passed - Triglycerides in normal range and within 360 days    Triglycerides  Date Value Ref Range Status  09/30/2019 126 0 - 149 mg/dL Final         Passed - Patient is not pregnant      . omeprazole (PRILOSEC) 20 MG capsule [Pharmacy Med Name: OMEPRAZOLE DR 20 MG CAPSULE] 90 capsule 0    Sig: TAKE ONE CAPSULE BY MOUTH DAILY     Gastroenterology: Proton Pump Inhibitors Failed - 09/24/2020  9:55 AM      Failed - Valid encounter within last 12 months    Recent Outpatient Visits  12 months ago Encounter for [employer] annual wellness exam   [facility] Jerrol Banana., MD   1 year ago CAD in native artery   [facility] Jerrol Banana., MD   2 years ago [place] Jerrol Banana., MD   2 years ago Essential (primary) hypertension   [facility] Jerrol Banana., MD   2 years ago Essential (primary) hypertension   [facility] Jerrol Banana., MD

## 2020-10-04 ENCOUNTER — Other Ambulatory Visit: Payer: Self-pay

## 2020-10-04 ENCOUNTER — Other Ambulatory Visit: Payer: Self-pay | Admitting: Family Medicine

## 2020-10-04 ENCOUNTER — Encounter: Payer: Self-pay | Admitting: Family Medicine

## 2020-10-04 ENCOUNTER — Ambulatory Visit (INDEPENDENT_AMBULATORY_CARE_PROVIDER_SITE_OTHER): Payer: Medicare Other | Admitting: Family Medicine

## 2020-10-04 ENCOUNTER — Telehealth: Payer: Self-pay

## 2020-10-04 VITALS — BP 177/75 | HR 85 | Temp 98.3°F | Resp 18 | Ht 62.0 in | Wt 127.8 lb

## 2020-10-04 DIAGNOSIS — I10 Essential (primary) hypertension: Secondary | ICD-10-CM

## 2020-10-04 DIAGNOSIS — M1990 Unspecified osteoarthritis, unspecified site: Secondary | ICD-10-CM | POA: Diagnosis not present

## 2020-10-04 DIAGNOSIS — Z0000 Encounter for general adult medical examination without abnormal findings: Secondary | ICD-10-CM | POA: Diagnosis not present

## 2020-10-04 DIAGNOSIS — Z950 Presence of cardiac pacemaker: Secondary | ICD-10-CM | POA: Diagnosis not present

## 2020-10-04 DIAGNOSIS — R413 Other amnesia: Secondary | ICD-10-CM

## 2020-10-04 DIAGNOSIS — E7800 Pure hypercholesterolemia, unspecified: Secondary | ICD-10-CM | POA: Diagnosis not present

## 2020-10-04 DIAGNOSIS — I2510 Atherosclerotic heart disease of native coronary artery without angina pectoris: Secondary | ICD-10-CM

## 2020-10-04 DIAGNOSIS — G4733 Obstructive sleep apnea (adult) (pediatric): Secondary | ICD-10-CM | POA: Diagnosis not present

## 2020-10-04 DIAGNOSIS — K219 Gastro-esophageal reflux disease without esophagitis: Secondary | ICD-10-CM

## 2020-10-04 DIAGNOSIS — K589 Irritable bowel syndrome without diarrhea: Secondary | ICD-10-CM | POA: Diagnosis not present

## 2020-10-04 MED ORDER — AMLODIPINE BESYLATE 5 MG PO TABS: 5.0000 mg | ORAL_TABLET | Freq: Every day | ORAL | 0 refills | Status: DC

## 2020-10-04 MED ORDER — OMEPRAZOLE 20 MG PO CPDR: 20.0000 mg | DELAYED_RELEASE_CAPSULE | Freq: Every day | ORAL | 0 refills | Status: DC

## 2020-10-04 MED ORDER — PRAVASTATIN SODIUM 40 MG PO TABS: 40.0000 mg | ORAL_TABLET | Freq: Every day | ORAL | 0 refills | Status: DC

## 2020-10-04 NOTE — Addendum Note (Signed)
Addended by: Mliss Sax on: 10/04/2020 12:46 PM   Modules accepted: Orders

## 2020-10-04 NOTE — Patient Instructions (Signed)
Try over the counter Tumeric once daily.

## 2020-10-04 NOTE — Progress Notes (Signed)
I,April Miller,acting as a scribe for Wilhemena Durie, MD.,have documented all relevant documentation on the behalf of Wilhemena Durie, MD,as directed by  Wilhemena Durie, MD while in the presence of Wilhemena Durie, MD.   Annual Wellness Visit     Patient: Melanie Franklin, Female    DOB: 07-01-1929, 86 y.o.   MRN: [id number] Visit Date: 10/04/2020  Today's Provider: Wilhemena Durie, MD   Chief Complaint  Patient presents with  . Medicare Wellness   Subjective    Melanie Franklin is a 86 y.o. female who presents today for her Annual Wellness Visit. She reports consuming a general and low sodium diet. The patient does not participate in regular exercise at present. She generally feels fairly well. She reports sleeping fairly well. She does not have additional problems to discuss today.   HPI Patient does have a complaint of nocturia and urinary frequency which has been a chronic problem.  He is unsteady on her feet but is very careful walking.  She is no longer driving. Her blood pressure at home runs good.      Medications: Outpatient Medications Prior to Visit  Medication Sig  . acetaminophen (TYLENOL) 650 MG CR tablet Take 650 mg by mouth 2 (two) times daily. 1 tablet in the morning and 1 at bedtime  . amLODipine (NORVASC) 5 MG tablet TAKE ONE TABLET BY MOUTH DAILY  . aspirin 81 MG tablet Take 81 mg by mouth daily.   . hydrochlorothiazide (HYDRODIURIL) 25 MG tablet TAKE ONE TABLET BY MOUTH DAILY  . magnesium oxide (MAG-OX) 400 MG tablet Take 400 mg by mouth daily.   . MULTIPLE VITAMINS PO Take 1 tablet by mouth daily.   . Multiple Vitamins-Minerals (ONE DAILY CALCIUM/IRON PO) Take 1 tablet by mouth daily.  Marland Kitchen omeprazole (PRILOSEC) 20 MG capsule TAKE ONE CAPSULE BY MOUTH DAILY  . OVER THE COUNTER MEDICATION Apply 1 application topically as needed (muscle and joint  pain). Horse Liniment Topical Analgesic 4% Natural Menthol  . oxyCODONE-acetaminophen  (PERCOCET/ROXICET) 5-325 MG tablet Take 1 tablet by mouth every 6 (six) hours as needed for severe pain.  . pravastatin (PRAVACHOL) 40 MG tablet TAKE ONE TABLET BY MOUTH DAILY  . rOPINIRole (REQUIP) 1 MG tablet Take 1 tablet (1 mg total) by mouth at bedtime.   No facility-administered medications prior to visit.    Allergies  Allergen Reactions  . Ciprofloxacin Other (See Comments)  . Meloxicam Other (See Comments)    Abdominal pain  . Tramadol Other (See Comments)    Abdominal pain   . Amoxicillin Swelling and Rash    Has patient had a PCN reaction causing immediate rash, facial/tongue/throat swelling, SOB or lightheadedness with hypotension: Unknown Has patient had a PCN reaction causing severe rash involving mucus membranes or skin necrosis: Unknown Has patient had a PCN reaction that required hospitalization: Unknown Has patient had a PCN reaction occurring within the last 10 years: Unknown If all of the above answers are "NO", then may proceed with Cephalosporin use.   Charolette Forward Lq  [Phenylephrine-Guaifenesin] Swelling and Rash    Patient Care Team: Jerrol Banana., MD as PCP - General (Family Medicine) Dingeldein, Remo Lipps, MD as Consulting Physician (Ophthalmology) Corey Skains, MD as Consulting Physician (Cardiology)  Review of Systems  Musculoskeletal: Positive for back pain and neck pain.  All other systems reviewed and are negative.      Objective    Vitals: BP (!) 177/75 (BP  Location: Left Arm, Patient Position: Sitting, Cuff Size: Normal)   Pulse 85   Temp 98.3 F (36.8 C) (Oral)   Resp 18   Ht 5\' 2"  (1.575 m)   Wt 127 lb 12.8 oz (58 kg)   SpO2 97%   BMI 23.37 kg/m  BP Readings from Last 3 Encounters:  10/04/20 (!) 177/75  04/27/20 (!) 156/78  09/30/19 (!) 144/86   Wt Readings from Last 3 Encounters:  10/04/20 127 lb 12.8 oz (58 kg)  04/26/20 135 lb (61.2 kg)  09/30/19 138 lb (62.6 kg)      Physical Exam Vitals reviewed.    Constitutional:      Appearance: Normal appearance. She is well-developed and normal weight.  HENT:     Head: Normocephalic.     Right Ear: External ear normal.     Left Ear: External ear normal.     Nose: Nose normal.  Eyes:     General: No scleral icterus.    Conjunctiva/sclera: Conjunctivae normal.  Cardiovascular:     Rate and Rhythm: Normal rate and regular rhythm.     Heart sounds: Normal heart sounds. No murmur heard.   Pulmonary:     Effort: Pulmonary effort is normal. No respiratory distress.     Breath sounds: Normal breath sounds.  Abdominal:     Palpations: Abdomen is soft.  Musculoskeletal:     Cervical back: Normal range of motion.  Skin:    General: Skin is warm and dry.  Neurological:     General: No focal deficit present.     Mental Status: She is alert and oriented to person, place, and time.  Psychiatric:        Mood and Affect: Mood normal.        Behavior: Behavior normal.        Thought Content: Thought content normal.        Judgment: Judgment normal.      Most recent functional status assessment: No flowsheet data found. Most recent fall risk assessment: Fall Risk  10/04/2020  Falls in the past year? 1  Number falls in past yr: 1  Injury with Fall? 1  Risk for fall due to : -  Follow up Falls evaluation completed    Most recent depression screenings: PHQ 2/9 Scores 10/04/2020 03/27/2019  PHQ - 2 Score - 0  PHQ- 9 Score - -  Exception Documentation Patient refusal -   Most recent cognitive screening:  Patient Declined.  6CIT Screen 03/21/2017  What Year? 0 points  What month? 0 points  What time? 0 points  Count back from 20 0 points  Months in reverse 0 points  Repeat phrase 6 points  Total Score 6   Most recent Audit-C alcohol use screening Alcohol Use Disorder Test (AUDIT) 10/04/2020  1. How often do you have a drink containing alcohol? 0  2. How many drinks containing alcohol do you have on a typical day when you are  drinking? 0  3. How often do you have six or more drinks on one occasion? 0  AUDIT-C Score 0  Alcohol Brief Interventions/Follow-up AUDIT Score <7 follow-up not indicated   A score of 3 or more in women, and 4 or more in men indicates increased risk for alcohol abuse, EXCEPT if all of the points are from question 1   No results found for any visits on 10/04/20.  Assessment & Plan     Annual wellness visit done today including the all of  the following: Reviewed patient's Family Medical History Reviewed and updated list of patient's medical providers Assessment of cognitive impairment was done Assessed patient's functional ability Established a written schedule for health screening [place] Completed and Reviewed  Exercise Activities and Dietary recommendations Goals    . DIET - INCREASE WATER INTAKE     Recommend increasing water intake to 6 glasses a day.     . Increase water intake     Recommend increasing water intake to 4-5 glasses a day.       Immunization History  Administered Date(s) Administered  . Fluad Quad(high Dose 65+) 09/30/2019, 09/13/2020  . Influenza Whole 08/14/2016, 08/19/2017  . Influenza, High Dose Seasonal PF 08/01/2015, 08/23/2018  . Pneumococcal Conjugate-13 10/04/2014  . Pneumococcal Polysaccharide-23 09/22/2005  . Td 02/09/2004  . Zoster 08/09/2008    Health Maintenance  Topic Date Due  . COVID-19 Vaccine (1) Never done  . TETANUS/TDAP  11/19/2026 (Originally 02/08/2014)  . INFLUENZA VACCINE  Completed  . DEXA SCAN  Completed  . PNA vac Low Risk Adult  Completed     Discussed health benefits of physical activity, and encouraged her to engage in regular exercise appropriate for her age and condition.    1. Encounter for Medicare annual wellness exam   2. Essential (primary) hypertension Coat hypertension.  Patient on amlodipine 5 HCTZ 25 sitter ARB - TSH - CBC w/Diff/Platelet - Comprehensive Metabolic Panel  (CMET)  3. Hypercholesteremia On pravastatin - Lipid panel  4. CAD in native artery All risk factors treated.  5. Artificial cardiac pacemaker By cardiology 6. Memory loss or impairment MMSE on next visit  7. Arthritis   8. Obstructive sleep apnea syndrome     No follow-ups on file.        Jjesus Dingley Cranford Mon, MD  [facility] [phone] (phone) [phone] (fax)  [place]

## 2020-10-04 NOTE — Telephone Encounter (Signed)
Wyline Mood from [facility] the RX correction on omeprazole, amlodipine and pravastatin Refills 90 with one refill.   Thanks,     Copied from [place] [phone]. Topic: Quick Communication - Rx Refill/Question >> Oct 04, 2020  1:09 PM Hinda Lenis D wrote: Manuela Schwartz from /  [place], [place] [address] Phone: [phone] Fax: [phone] Need a call back from a nurse to go over all Pts medication

## 2020-10-05 LAB — CBC WITH DIFFERENTIAL/PLATELET
Basophils Absolute: 0.1 10*3/uL (ref 0.0–0.2)
Basos: 1 %
EOS (ABSOLUTE): 0.1 10*3/uL (ref 0.0–0.4)
Eos: 1 %
Hematocrit: 38.8 % (ref 34.0–46.6)
Hemoglobin: 13.3 g/dL (ref 11.1–15.9)
Immature Grans (Abs): 0 10*3/uL (ref 0.0–0.1)
Immature Granulocytes: 0 %
Lymphocytes Absolute: 2.1 10*3/uL (ref 0.7–3.1)
Lymphs: 31 %
MCH: 33.1 pg — ABNORMAL HIGH (ref 26.6–33.0)
MCHC: 34.3 g/dL (ref 31.5–35.7)
MCV: 97 fL (ref 79–97)
Monocytes Absolute: 0.8 10*3/uL (ref 0.1–0.9)
Monocytes: 12 %
Neutrophils Absolute: 3.9 10*3/uL (ref 1.4–7.0)
Neutrophils: 55 %
Platelets: 274 10*3/uL (ref 150–450)
RBC: 4.02 x10E6/uL (ref 3.77–5.28)
RDW: 11.8 % (ref 11.7–15.4)
WBC: 7 10*3/uL (ref 3.4–10.8)

## 2020-10-05 LAB — COMPREHENSIVE METABOLIC PANEL
ALT: 13 IU/L (ref 0–32)
AST: 19 IU/L (ref 0–40)
Albumin/Globulin Ratio: 2.3 — ABNORMAL HIGH (ref 1.2–2.2)
Albumin: 5 g/dL — ABNORMAL HIGH (ref 3.6–4.6)
Alkaline Phosphatase: 84 IU/L (ref 44–121)
BUN/Creatinine Ratio: 25 (ref 12–28)
BUN: 13 mg/dL (ref 8–27)
Bilirubin Total: 0.4 mg/dL (ref 0.0–1.2)
CO2: 22 mmol/L (ref 20–29)
Calcium: 10 mg/dL (ref 8.7–10.3)
Chloride: 100 mmol/L (ref 96–106)
Creatinine, Ser: 0.51 mg/dL — ABNORMAL LOW (ref 0.57–1.00)
GFR calc Af Amer: 99 mL/min/{1.73_m2} (ref >59)
GFR calc non Af Amer: 86 mL/min/{1.73_m2} (ref >59)
Globulin, Total: 2.2 g/dL (ref 1.5–4.5)
Glucose: 109 mg/dL — ABNORMAL HIGH (ref 65–99)
Potassium: 3.9 mmol/L (ref 3.5–5.2)
Sodium: 140 mmol/L (ref 134–144)
Total Protein: 7.2 g/dL (ref 6.0–8.5)

## 2020-10-05 LAB — LIPID PANEL
Chol/HDL Ratio: 2.5 ratio (ref 0.0–4.4)
Cholesterol, Total: 192 mg/dL (ref 100–199)
HDL: 78 mg/dL (ref >39)
LDL Chol Calc (NIH): 88 mg/dL (ref 0–99)
Triglycerides: 157 mg/dL — ABNORMAL HIGH (ref 0–149)
VLDL Cholesterol Cal: 26 mg/dL (ref 5–40)

## 2020-10-05 LAB — TSH: TSH: 1.05 u[IU]/mL (ref 0.450–4.500)

## 2020-10-25 ENCOUNTER — Other Ambulatory Visit: Payer: Self-pay | Admitting: Internal Medicine

## 2020-10-25 ENCOUNTER — Ambulatory Visit: Payer: Medicare Other | Attending: Internal Medicine

## 2020-10-25 DIAGNOSIS — Z23 Encounter for immunization: Secondary | ICD-10-CM

## 2020-10-25 NOTE — Progress Notes (Signed)
   Covid-19 Vaccination Clinic  Name:  Melanie Franklin    MRN: [id number] DOB: [birth year]  10/25/2020  [name] was observed post Covid-19 immunization for 15 minutes without incident. She was provided with Vaccine Information Sheet and instruction to access the V-Safe system.   [name] was instructed to call 911 with any severe reactions post vaccine: Marland Kitchen Difficulty breathing  . Swelling of face and throat  . A fast heartbeat  . A bad rash all over body  . Dizziness and weakness   Immunizations Administered    Name Date Dose VIS Date Route   Pfizer COVID-19 Vaccine 10/25/2020  9:35 AM 0.3 mL 09/07/2020 Intramuscular   Manufacturer: [place]   Lot: [id number]   [place]: [id number]

## 2021-01-03 DIAGNOSIS — I442 Atrioventricular block, complete: Secondary | ICD-10-CM | POA: Diagnosis not present

## 2021-02-01 ENCOUNTER — Ambulatory Visit: Payer: Self-pay | Admitting: Family Medicine

## 2021-03-17 DIAGNOSIS — E782 Mixed hyperlipidemia: Secondary | ICD-10-CM | POA: Diagnosis not present

## 2021-03-17 DIAGNOSIS — I25810 Atherosclerosis of coronary artery bypass graft(s) without angina pectoris: Secondary | ICD-10-CM | POA: Diagnosis not present

## 2021-03-17 DIAGNOSIS — I6523 Occlusion and stenosis of bilateral carotid arteries: Secondary | ICD-10-CM | POA: Diagnosis not present

## 2021-03-17 DIAGNOSIS — I442 Atrioventricular block, complete: Secondary | ICD-10-CM | POA: Diagnosis not present

## 2021-03-17 DIAGNOSIS — I10 Essential (primary) hypertension: Secondary | ICD-10-CM | POA: Diagnosis not present

## 2021-03-17 DIAGNOSIS — Z950 Presence of cardiac pacemaker: Secondary | ICD-10-CM | POA: Diagnosis not present

## 2021-04-04 ENCOUNTER — Ambulatory Visit: Payer: Self-pay | Admitting: Family Medicine

## 2021-04-05 ENCOUNTER — Other Ambulatory Visit: Payer: Self-pay | Admitting: Family Medicine

## 2021-04-05 DIAGNOSIS — K219 Gastro-esophageal reflux disease without esophagitis: Secondary | ICD-10-CM

## 2021-05-24 DIAGNOSIS — Z20822 Contact with and (suspected) exposure to covid-19: Secondary | ICD-10-CM | POA: Diagnosis not present

## 2021-07-05 ENCOUNTER — Other Ambulatory Visit: Payer: Self-pay | Admitting: Family Medicine

## 2021-07-05 ENCOUNTER — Telehealth: Payer: Self-pay

## 2021-07-05 NOTE — Telephone Encounter (Signed)
Copied from [place] [phone]. Topic: General - Call Back - No Documentation >> Jul 05, 2021 10:32 AM Lennox Solders wrote: Reason for CRM: Pt is returning [facility] call

## 2021-07-05 NOTE — Telephone Encounter (Signed)
Requested medication (s) are due for refill today: yes  Requested medication (s) are on the active medication list: yes   Last refill:  04/05/2021  Future visit scheduled:  no  Notes to clinic:  overdue for follow up Vm left for patient to call office to schedule    Requested Prescriptions  Pending Prescriptions Disp Refills   amLODipine (NORVASC) 5 MG tablet [Pharmacy Med Name: amLODIPine BESYLATE 5 MG TAB] 90 tablet 0    Sig: TAKE ONE TABLET BY MOUTH DAILY     Cardiovascular:  Calcium Channel Blockers Failed - 07/05/2021  6:21 AM      Failed - Last BP in normal range    BP Readings from Last 1 Encounters:  10/04/20 (!) 177/75          Failed - Valid encounter within last 6 months    Recent Outpatient Visits           9 months ago Encounter for [employer] annual wellness exam   [facility] Jerrol Banana., MD   1 year ago Encounter for Medicare annual wellness exam   [facility] Jerrol Banana., MD   2 years ago CAD in native artery   [facility] Jerrol Banana., MD   3 years ago Hoarseness   [facility] Jerrol Banana., MD   3 years ago Essential (primary) hypertension   [facility] Jerrol Banana., MD

## 2021-08-29 DIAGNOSIS — I442 Atrioventricular block, complete: Secondary | ICD-10-CM | POA: Diagnosis not present

## 2021-10-04 ENCOUNTER — Other Ambulatory Visit: Payer: Self-pay | Admitting: Family Medicine

## 2021-10-04 DIAGNOSIS — K219 Gastro-esophageal reflux disease without esophagitis: Secondary | ICD-10-CM

## 2021-10-04 NOTE — Telephone Encounter (Signed)
Requested medications are due for refill today.  yes  Requested medications are on the active medications list.  yes  Last refill. varied  Future visit scheduled.   no  Notes to clinic.  Labs are expired. Pt last seen 10/04/2020

## 2021-10-06 DIAGNOSIS — Z20828 Contact with and (suspected) exposure to other viral communicable diseases: Secondary | ICD-10-CM | POA: Diagnosis not present

## 2021-12-12 DIAGNOSIS — I25810 Atherosclerosis of coronary artery bypass graft(s) without angina pectoris: Secondary | ICD-10-CM | POA: Diagnosis not present

## 2021-12-12 DIAGNOSIS — E782 Mixed hyperlipidemia: Secondary | ICD-10-CM | POA: Diagnosis not present

## 2021-12-12 DIAGNOSIS — I442 Atrioventricular block, complete: Secondary | ICD-10-CM | POA: Diagnosis not present

## 2021-12-12 DIAGNOSIS — I10 Essential (primary) hypertension: Secondary | ICD-10-CM | POA: Diagnosis not present

## 2021-12-12 DIAGNOSIS — I6523 Occlusion and stenosis of bilateral carotid arteries: Secondary | ICD-10-CM | POA: Diagnosis not present

## 2021-12-12 DIAGNOSIS — Z950 Presence of cardiac pacemaker: Secondary | ICD-10-CM | POA: Diagnosis not present

## 2021-12-30 ENCOUNTER — Other Ambulatory Visit: Payer: Self-pay | Admitting: Family Medicine

## 2021-12-30 DIAGNOSIS — K219 Gastro-esophageal reflux disease without esophagitis: Secondary | ICD-10-CM

## 2021-12-30 NOTE — Telephone Encounter (Signed)
Requested medications are due for refill today.  Yes - all 3  Requested medications are on the active medications list.  Yes - all 3  Last refill. 10/04/2021 #90 with 1 refill for all 3 medications  Future visit scheduled.   yes  Notes to clinic.  Pt last seen 10/04/2020. More than 3 months overdue for OV.    Requested Prescriptions  Pending Prescriptions Disp Refills   amLODipine (NORVASC) 5 MG tablet [Pharmacy Med Name: amLODIPine BESYLATE 5 MG TAB] 90 tablet 0    Sig: TAKE ONE TABLET BY MOUTH DAILY     Cardiovascular: Calcium Channel Blockers 2 Failed - 12/30/2021  6:50 AM      Failed - Last BP in normal range    BP Readings from Last 1 Encounters:  10/04/20 (!) 177/75          Failed - Valid encounter within last 6 months    Recent Outpatient Visits           1 year ago Encounter for [employer] annual wellness exam   [facility] Jerrol Banana., MD   2 years ago Encounter for [employer] annual wellness exam   [facility] Jerrol Banana., MD   3 years ago CAD in native artery   [facility] Jerrol Banana., MD   3 years ago [place] Jerrol Banana., MD   3 years ago Essential (primary) hypertension   [facility] Jerrol Banana., MD              Passed - Last Heart Rate in normal range    Pulse Readings from Last 1 Encounters:  10/04/20 85           omeprazole (PRILOSEC) 20 MG capsule [Pharmacy Med Name: OMEPRAZOLE DR 20 MG CAPSULE] 90 capsule 0    Sig: TAKE ONE CAPSULE BY MOUTH DAILY.     Gastroenterology: Proton Pump Inhibitors Failed - 12/30/2021  6:50 AM      Failed - Valid encounter within last 12 months    Recent Outpatient Visits           1 year ago Encounter for Medicare annual wellness exam   [facility] Jerrol Banana., MD   2 years ago Encounter for [employer] annual wellness exam   [facility] Jerrol Banana., MD   3 years ago CAD in native artery   [facility] Jerrol Banana., MD   3 years ago [employer] Jerrol Banana., MD   3 years ago Essential (primary) hypertension   [facility] Jerrol Banana., MD               pravastatin (PRAVACHOL) 40 MG tablet [Pharmacy Med Name: PRAVASTATIN SODIUM 40 MG TAB] 90 tablet 0    Sig: TAKE ONE TABLET BY MOUTH DAILY     Cardiovascular:  Antilipid - Statins Failed - 12/30/2021  6:50 AM      Failed - Valid encounter within last 12 months    Recent Outpatient Visits           1 year ago Encounter for Medicare annual wellness exam   [facility] Jerrol Banana., MD   2 years ago Encounter for [employer] annual wellness exam   [facility] Jerrol Banana., MD   3 years ago  CAD in native artery   [facility] Jerrol Banana., MD   3 years ago [place] Jerrol Banana., MD   3 years ago Essential (primary) hypertension   [facility] Jerrol Banana., MD              Failed - Lipid Panel in normal range within the last 12 months    Cholesterol, Total  Date Value Ref Range Status  10/04/2020 192 100 - 199 mg/dL Final   LDL Chol Calc (NIH)  Date Value Ref Range Status  10/04/2020 88 0 - 99 mg/dL Final   HDL  Date Value Ref Range Status  10/04/2020 78 >39 mg/dL Final   Triglycerides  Date Value Ref Range Status  10/04/2020 157 (H) 0 - 149 mg/dL Final         Passed - Patient is not pregnant

## 2022-01-01 DIAGNOSIS — Z20822 Contact with and (suspected) exposure to covid-19: Secondary | ICD-10-CM | POA: Diagnosis not present

## 2022-01-09 ENCOUNTER — Ambulatory Visit: Payer: Medicare Other | Admitting: Family Medicine

## 2022-01-15 ENCOUNTER — Ambulatory Visit (INDEPENDENT_AMBULATORY_CARE_PROVIDER_SITE_OTHER): Payer: Medicare Other | Admitting: Physician Assistant

## 2022-01-15 ENCOUNTER — Encounter: Payer: Self-pay | Admitting: Physician Assistant

## 2022-01-15 ENCOUNTER — Other Ambulatory Visit: Payer: Self-pay

## 2022-01-15 VITALS — BP 190/66 | HR 92 | Ht 63.0 in | Wt 126.8 lb

## 2022-01-15 DIAGNOSIS — I10 Essential (primary) hypertension: Secondary | ICD-10-CM | POA: Diagnosis not present

## 2022-01-15 DIAGNOSIS — K219 Gastro-esophageal reflux disease without esophagitis: Secondary | ICD-10-CM

## 2022-01-15 DIAGNOSIS — Z23 Encounter for immunization: Secondary | ICD-10-CM

## 2022-01-15 DIAGNOSIS — E7800 Pure hypercholesterolemia, unspecified: Secondary | ICD-10-CM

## 2022-01-15 MED ORDER — AMLODIPINE BESYLATE 5 MG PO TABS: 5.0000 mg | ORAL_TABLET | Freq: Every day | ORAL | 3 refills | Status: DC

## 2022-01-15 MED ORDER — OMEPRAZOLE 20 MG PO CPDR: 20.0000 mg | DELAYED_RELEASE_CAPSULE | Freq: Every day | ORAL | 3 refills | Status: DC

## 2022-01-15 MED ORDER — PRAVASTATIN SODIUM 40 MG PO TABS: 40.0000 mg | ORAL_TABLET | Freq: Every day | ORAL | 3 refills | Status: DC

## 2022-01-15 NOTE — Assessment & Plan Note (Signed)
Will recheck lipids Can continue pravastatin 40 as no side effects

## 2022-01-15 NOTE — Assessment & Plan Note (Signed)
Denies symptoms Refilled omeprazole 20 mg

## 2022-01-15 NOTE — Progress Notes (Signed)
I,Melanie Franklin,acting as a Education administrator for [employer], PA-C.,have documented all relevant documentation on the behalf of Melanie Kirschner, PA-C,as directed by  Melanie Kirschner, PA-C while in the presence of Melanie Kirschner, PA-C.  Established Patient Office Visit  Subjective:  Patient ID: Melanie Franklin, female    DOB: [birth year]  Age: 88 y.o. MRN: [id number]  CC: med refills   HPI Melanie Franklin presents for medication refills. Patient last time in office was 10/04/2020 for CPE. Patient is needing a refill on amlodipine 5 mg, omeprazole 20 mg, and pravastatin 40 mg.  Pt denies taking HCTZ or ropinirole.   Hypertension, follow-up  BP Readings from Last 3 Encounters:  01/15/22 (!) 190/66  10/04/20 (!) 177/75  04/27/20 (!) 156/78   Wt Readings from Last 3 Encounters:  01/15/22 126 lb 12.8 oz (57.5 kg)  10/04/20 127 lb 12.8 oz (58 kg)  04/26/20 135 lb (61.2 kg)     Melanie Franklin was last seen for hypertension over 1 years ago.  BP at that visit was 177/75. Melanie Franklin has been seen by her cardiologist since, 12/11/2021 BP was 160/70, 03/17/2021 BP was 150/64. HCTZ is not present in her cardiologist's med list.   Melanie Franklin reports excellent compliance with treatment. Melanie Franklin is not having side effects.  Melanie Franklin is following a Regular diet. Melanie Franklin is not exercising. Melanie Franklin does not smoke.  Use of agents associated with hypertension: none.   Outside blood pressures has not checked in a few days Symptoms: No chest pain No chest pressure  No palpitations No syncope  No dyspnea No orthopnea  No paroxysmal nocturnal dyspnea No lower extremity edema   Pertinent labs: Lab Results  Component Value Date   CHOL 192 10/04/2020   HDL 78 10/04/2020   LDLCALC 88 10/04/2020   TRIG 157 (H) 10/04/2020   CHOLHDL 2.5 10/04/2020   Lab Results  Component Value Date   NA 140 10/04/2020   K 3.9 10/04/2020   CREATININE 0.51 (L) 10/04/2020   GFRNONAA 86 10/04/2020   GLUCOSE 109 (H) 10/04/2020   TSH 1.050 10/04/2020     The  ASCVD Risk score (Arnett DK, et al., 2019) failed to calculate for the following reasons:   The 2019 ASCVD risk score is only valid for ages 64 to 7   --------------------------------------------------------------------------------------------------- Lipid/Cholesterol, Follow-up  Last lipid panel Other pertinent labs  Lab Results  Component Value Date   CHOL 192 10/04/2020   HDL 78 10/04/2020   LDLCALC 88 10/04/2020   TRIG 157 (H) 10/04/2020   CHOLHDL 2.5 10/04/2020   Lab Results  Component Value Date   ALT 13 10/04/2020   AST 19 10/04/2020   PLT 274 10/04/2020   TSH 1.050 10/04/2020     Melanie Franklin was last seen for this over 1 years ago.  Management since that visit includes continue statin.  Melanie Franklin reports excellent compliance with treatment. Melanie Franklin is not having side effects.   Symptoms: No chest pain No chest pressure/discomfort  No dyspnea No lower extremity edema  No numbness or tingling of extremity No orthopnea  No palpitations No paroxysmal nocturnal dyspnea  No speech difficulty No syncope   Current diet: in general, a "healthy" diet  , well balanced Current exercise: none  The ASCVD Risk score (Arnett DK, et al., 2019) failed to calculate for the following reasons:   The 2019 ASCVD risk score is only valid for ages 42 to 59  --------------------------------------------------------------------------------------------------- GERD, Follow up:  The patient was last seen for GERD over  1 years ago. Changes made since that visit include continue current treatment.  Melanie Franklin reports excellent compliance with treatment. Melanie Franklin is not having side effects. Marland Kitchen  -----------------------------------------------------------------------------------------   Past Medical History:  Diagnosis Date   Coronary artery disease    Family history of adverse reaction to anesthesia    "sister had a reaction to meds given during a heart cath that almost killed her."   GERD (gastroesophageal reflux  disease)    Hypertension    Presence of permanent cardiac pacemaker     Past Surgical History:  Procedure Laterality Date   CARDIAC CATHETERIZATION     CATARACT EXTRACTION Bilateral    CORONARY ARTERY BYPASS GRAFT  03/09/2009   x 4 [place] ARTHROSCOPY Left    LASIK     PACEMAKER INSERTION     PACEMAKER INSERTION Left 07/12/2017   Procedure: PACEMAKER CHANGE OUT;  Surgeon: Marzetta Board, MD;  Location: [facility] ORS;  Service: Cardiovascular;  Laterality: Left;   TONSILLECTOMY      Family History  Problem Relation Age of Onset   Hypertension Mother    Colon cancer Mother    Cerebrovascular Accident Mother    Ulcers Mother    Other Mother        nervous breakdown   Stroke Father    Congestive Heart Failure Sister    Kidney Stones Sister    Cancer Sister        oral   Hypertension Brother    Hypertension Sister    Diabetes Sister    CAD Sister    Other Sister    Breast cancer Neg Hx     Social History   Socioeconomic History   Marital status: Widowed    Spouse name: Not on file   Number of children: 2   Years of education: Not on file   Highest education level: 12th grade  Occupational History   Not on file  Tobacco Use   Smoking status: Never   Smokeless tobacco: Never  Vaping Use   Vaping Use: Never used  Substance and Sexual Activity   Alcohol use: No   Drug use: No   Sexual activity: Not on file  Other Topics Concern   Not on file  Social History Narrative   Not on file   Social Determinants of Health   Financial Resource Strain: Not on file  Food Insecurity: Not on file  Transportation Needs: Not on file  Physical Activity: Not on file  Stress: Not on file  Social Connections: Not on file  Intimate Partner Violence: Not on file    Outpatient Medications Prior to Visit  Medication Sig Dispense Refill   acetaminophen (TYLENOL) 650 MG CR tablet Take 650 mg by mouth 2 (two) times daily. 1  tablet in the morning and 1 at bedtime     aspirin 81 MG tablet Take 81 mg by mouth daily.      magnesium oxide (MAG-OX) 400 MG tablet Take 400 mg by mouth daily.      MULTIPLE VITAMINS PO Take 1 tablet by mouth daily.      Multiple Vitamins-Minerals (ONE DAILY CALCIUM/IRON PO) Take 1 tablet by mouth daily.     OVER THE COUNTER MEDICATION Apply 1 application topically as needed (muscle and joint  pain). Horse Liniment Topical Analgesic 4% Natural Menthol     amLODipine (NORVASC) 5 MG tablet TAKE ONE TABLET BY MOUTH DAILY 90 tablet 0  omeprazole (PRILOSEC) 20 MG capsule TAKE ONE CAPSULE BY MOUTH DAILY. 90 capsule 0   pravastatin (PRAVACHOL) 40 MG tablet TAKE ONE TABLET BY MOUTH DAILY 90 tablet 0   rOPINIRole (REQUIP) 1 MG tablet Take 1 tablet (1 mg total) by mouth at bedtime. 30 tablet 5   hydrochlorothiazide (HYDRODIURIL) 25 MG tablet TAKE ONE TABLET BY MOUTH DAILY (Patient not taking: Reported on 01/15/2022) 90 tablet 3   oxyCODONE-acetaminophen (PERCOCET/ROXICET) 5-325 MG tablet Take 1 tablet by mouth every 6 (six) hours as needed for severe pain. (Patient not taking: Reported on 01/15/2022) 20 tablet 0   No facility-administered medications prior to visit.    Allergies  Allergen Reactions   Ciprofloxacin Other (See Comments)   Meloxicam Other (See Comments)    Abdominal pain   Tramadol Other (See Comments)    Abdominal pain    Amoxicillin Swelling and Rash    Has patient had a PCN reaction causing immediate rash, facial/tongue/throat swelling, SOB or lightheadedness with hypotension: Unknown Has patient had a PCN reaction causing severe rash involving mucus membranes or skin necrosis: Unknown Has patient had a PCN reaction that required hospitalization: Unknown Has patient had a PCN reaction occurring within the last 10 years: Unknown If all of the above answers are "NO", then may proceed with Cephalosporin use.    Entex Lq  [Phenylephrine-Guaifenesin] Swelling and Rash     ROS Review of Systems  Constitutional:  Negative for fatigue and fever.  Respiratory:  Negative for cough and shortness of breath.   Cardiovascular:  Negative for chest pain and leg swelling.  Gastrointestinal:  Negative for abdominal pain.  Neurological:  Negative for dizziness and headaches.     Objective:    Physical Exam Constitutional:      Appearance: Normal appearance. Melanie Franklin is not ill-appearing.  HENT:     Head: Normocephalic.  Eyes:     Conjunctiva/sclera: Conjunctivae normal.  Cardiovascular:     Rate and Rhythm: Normal rate and regular rhythm.     Pulses: Normal pulses.     Heart sounds: Normal heart sounds.  Pulmonary:     Effort: Pulmonary effort is normal.     Breath sounds: Normal breath sounds.  Neurological:     Mental Status: Melanie Franklin is oriented to person, place, and time.  Psychiatric:        Mood and Affect: Mood normal.        Behavior: Behavior normal.    BP (!) 190/66 (BP Location: Left Arm, Patient Position: Sitting, Cuff Size: Normal)    Pulse 92    Ht 5\' 3"  (1.6 m)    Wt 126 lb 12.8 oz (57.5 kg)    SpO2 100%    BMI 22.46 kg/m  Wt Readings from Last 3 Encounters:  01/15/22 126 lb 12.8 oz (57.5 kg)  10/04/20 127 lb 12.8 oz (58 kg)  04/26/20 135 lb (61.2 kg)     Health Maintenance Due  Topic Date Due   Zoster Vaccines- Shingrix (1 of 2) Never done    There are no preventive care reminders to display for this patient.  Lab Results  Component Value Date   TSH 1.050 10/04/2020   Lab Results  Component Value Date   WBC 7.0 10/04/2020   HGB 13.3 10/04/2020   HCT 38.8 10/04/2020   MCV 97 10/04/2020   PLT 274 10/04/2020   Lab Results  Component Value Date   NA 140 10/04/2020   K 3.9 10/04/2020   CO2 22 10/04/2020  GLUCOSE 109 (H) 10/04/2020   BUN 13 10/04/2020   CREATININE 0.51 (L) 10/04/2020   BILITOT 0.4 10/04/2020   ALKPHOS 84 10/04/2020   AST 19 10/04/2020   ALT 13 10/04/2020   PROT 7.2 10/04/2020   ALBUMIN 5.0 (H)  10/04/2020   CALCIUM 10.0 10/04/2020   ANIONGAP 9 06/28/2017   Lab Results  Component Value Date   CHOL 192 10/04/2020   Lab Results  Component Value Date   HDL 78 10/04/2020   Lab Results  Component Value Date   LDLCALC 88 10/04/2020   Lab Results  Component Value Date   TRIG 157 (H) 10/04/2020   Lab Results  Component Value Date   CHOLHDL 2.5 10/04/2020   No results found for: HGBA1C    Assessment & Plan:   Problem List Items Addressed This Visit       Cardiovascular and Mediastinum   Essential (primary) hypertension - Primary    Elevated in office today. Pt unsure when HCTZ was d/c. Cardiologist has continued amlodipine 5 mg daily, which Melanie Franklin attests to taking. Some left in rx bottle today. Recommended Melanie Franklin f/u in 1 mo w/ Dr Rosanna Randy for BP eval Recommending MSE next visit.      Relevant Medications   amLODipine (NORVASC) 5 MG tablet   pravastatin (PRAVACHOL) 40 MG tablet   Other Relevant Orders   Comprehensive Metabolic Panel (CMET)   CBC w/Diff/Platelet     Digestive   Gastro-esophageal reflux disease without esophagitis    Denies symptoms Refilled omeprazole 20 mg      Relevant Medications   omeprazole (PRILOSEC) 20 MG capsule   Other Relevant Orders   CBC w/Diff/Platelet     Other   Hypercholesteremia    Will recheck lipids Can continue pravastatin 40 as no side effects      Relevant Medications   amLODipine (NORVASC) 5 MG tablet   pravastatin (PRAVACHOL) 40 MG tablet   Other Relevant Orders   Lipid Profile   CBC w/Diff/Platelet   Other Visit Diagnoses     Need for immunization against influenza       Relevant Orders   Flu Vaccine QUAD High Dose(Fluad) (Completed)        Follow-up: Return in about 1 month (around 02/12/2022) for hypertension.   I, Melanie Kirschner, PA-C have reviewed all documentation for this visit. The documentation on  01/15/2022 for the exam, diagnosis, procedures, and orders are all accurate and complete.    Melanie Kirschner, PA-C [facility] [address] [place], [place], [address] Office: [phone] Fax: [phone]

## 2022-01-15 NOTE — Assessment & Plan Note (Addendum)
Elevated in office today. Pt unsure when HCTZ was d/c. Cardiologist has continued amlodipine 5 mg daily, which she attests to taking. Some left in rx bottle today. Recommended she f/u in 1 mo w/ Dr Rosanna Randy for BP eval Recommending MSE next visit.

## 2022-01-29 ENCOUNTER — Ambulatory Visit: Payer: Self-pay | Admitting: *Deleted

## 2022-01-29 NOTE — Telephone Encounter (Signed)
Put her on my schedule  1 morning next week.

## 2022-01-29 NOTE — Telephone Encounter (Signed)
Reason for Disposition ? Systolic BP  >= 621 OR Diastolic >= 308 ? ?Answer Assessment - Initial Assessment Questions ?1. BLOOD PRESSURE: "What is the blood pressure?" "Did you take at least two measurements 5 minutes apart?" ?    168/69, (657/84,696/29) 164/67- Sunday ?2. ONSET: "When did you take your blood pressure?" ?    10:00 today ?3. HOW: "How did you obtain the blood pressure?" (e.g., visiting nurse, automatic home BP monitor) ?    Automatic cuff- arm ?4. HISTORY: "Do you have a history of high blood pressure?" ?    No- hx of heart disease ?5. MEDICATIONS: "Are you taking any medications for blood pressure?" "Have you missed any doses recently?" ?    Amlodipine  ?6. OTHER SYMPTOMS: "Do you have any symptoms?" (e.g., headache, chest pain, blurred vision, difficulty breathing, weakness) ?    Patient feels wobbly at times ?7. PREGNANCY: "Is there any chance you are pregnant?" "When was your last menstrual period?" ?    *No Answer* ? ?Protocols used: Blood Pressure - High-A-AH ? ?

## 2022-01-29 NOTE — Telephone Encounter (Signed)
?  Chief Complaint: elevated BP ?Symptoms: elevated BP ?Frequency: elevated since last visit- 2/27 ?Pertinent Negatives: Patient denies headache, chest pain, blurred vision, difficulty breathing, weakness ?Disposition: '[]'$ ED /'[]'$ Urgent Care (no appt availability in office) / '[]'$ Appointment(In office/virtual)/ '[]'$  [facility]/ '[]'$ Home Care/ '[]'$ Refused Recommended Disposition /'[]'$ [facility]/ '[]'$  Follow-up with PCP ?Additional Notes: Patient would like her appointment moved up- no sooner appointment available- and patient is requesting Dr Rosanna Randy. Note sent for review   ?

## 2022-01-29 NOTE — Telephone Encounter (Signed)
Please schedule pt? Thanks. ?

## 2022-02-02 NOTE — Progress Notes (Signed)
?  ? ?I,Elena D DeSanto,acting as a scribe for Wilhemena Durie, MD.,have documented all relevant documentation on the behalf of Wilhemena Durie, MD,as directed by  Wilhemena Durie, MD while in the presence of Wilhemena Durie, MD. ? ? ?Established patient visit ? ? ?Patient: Melanie Franklin   DOB: Mar 08, 1929   88 y.o. Female  MRN: [id number] ?Visit Date: 02/05/2022 ? ?Today's healthcare provider: Wilhemena Durie, MD  ? ?No chief complaint on file. ? ?Subjective  ?  ?HPI  ?88 year old widowed white female continues to live alone but her family checks on her regularly.  She has several issues today.  The main problem is gait disturbance and some unsteadiness.  Because it dizziness but is more that she feels unsteady on her feet.  The room does not spin.  No syncope or presyncope.  The other issue is that she has nocturia several times a night.  No urinary symptoms during the day. ? ?Hypertension, follow-up ? ?BP Readings from Last 3 Encounters:  ?02/05/22 130/78  ?01/15/22 (!) 190/66  ?10/04/20 (!) 177/75  ? Wt Readings from Last 3 Encounters:  ?02/05/22 126 lb (57.2 kg)  ?01/15/22 126 lb 12.8 oz (57.5 kg)  ?10/04/20 127 lb 12.8 oz (58 kg)  ?  ? ?She was last seen for hypertension 1 months ago.  ?BP at that visit was 190/66. ?Management since that visit includes; seen by Sharen Counter. Continue amlodipine 5 mg. ?She reports good compliance with treatment. ? ?Outside blood pressures are .  170-180, 2 weeks ago but has been coming down since then.  Within the last week it has been running 626-948 systolic. ? ?She does not smoke. ? ?Use of agents associated with hypertension: none.  ? ?--------------------------------------------------------------------------------------------------- ?Lipid/Cholesterol, follow-up ? ?Last Lipid Panel: ?Lab Results  ?Component Value Date  ? CHOL 192 10/04/2020  ? [place] 88 10/04/2020  ? HDL 78 10/04/2020  ? TRIG 157 (H) 10/04/2020  ? ? ?She was last seen for this 10/04/2020.   ?Management since that visit includes; on pravastatin. ? ?She reports good compliance with treatment. ?She is not having side effects.  ? ? ?Last metabolic panel ?Lab Results  ?Component Value Date  ? GLUCOSE 109 (H) 10/04/2020  ? NA 140 10/04/2020  ? K 3.9 10/04/2020  ? BUN 13 10/04/2020  ? CREATININE 0.51 (L) 10/04/2020  ? GFRNONAA 86 10/04/2020  ? CALCIUM 10.0 10/04/2020  ? AST 19 10/04/2020  ? ALT 13 10/04/2020  ? ?The ASCVD Risk score (Arnett DK, et al., 2019) failed to calculate for the following reasons: ?  The 2019 ASCVD risk score is only valid for ages 11 to 36 ? ?--------------------------------------------------------------------------------------------------- ? ? ?Medications: ?Outpatient Medications Prior to Visit  ?Medication Sig  ? acetaminophen (TYLENOL) 650 MG CR tablet Take 650 mg by mouth 2 (two) times daily. 1 tablet in the morning and 1 at bedtime  ? amLODipine (NORVASC) 5 MG tablet Take 1 tablet (5 mg total) by mouth daily.  ? aspirin 81 MG tablet Take 81 mg by mouth daily.   ? magnesium oxide (MAG-OX) 400 MG tablet Take 400 mg by mouth daily.   ? MULTIPLE VITAMINS PO Take 1 tablet by mouth daily.   ? Multiple Vitamins-Minerals (ONE DAILY CALCIUM/IRON PO) Take 1 tablet by mouth daily.  ? omeprazole (PRILOSEC) 20 MG capsule Take 1 capsule (20 mg total) by mouth daily.  ? OVER THE COUNTER MEDICATION Apply 1 application topically as needed (muscle and joint  pain).  Horse Liniment Topical Analgesic ?4% Natural Menthol  ? pravastatin (PRAVACHOL) 40 MG tablet Take 1 tablet (40 mg total) by mouth daily.  ? ?No facility-administered medications prior to visit.  ? ? ?Review of Systems  ?Constitutional:  Negative for appetite change, chills, fatigue and fever.  ?Respiratory:  Negative for chest tightness and shortness of breath.   ?Cardiovascular:  Negative for chest pain and palpitations.  ?Gastrointestinal:  Negative for abdominal pain, nausea and vomiting.  ?Neurological:  Positive for dizziness,  weakness and light-headedness. Negative for headaches.  ? ?  ?  Objective  ?  ?BP 130/78 (BP Location: Right Arm, Patient Position: Sitting, Cuff Size: Normal)   Pulse 84   Temp 98.3 ?F (36.8 ?C) (Oral)   Wt 126 lb (57.2 kg)   SpO2 97%   BMI 22.32 kg/m?  ?  ? ?Physical Exam ?Vitals reviewed.  ?Constitutional:   ?   Appearance: Normal appearance. She is well-developed and normal weight.  ?HENT:  ?   Head: Normocephalic.  ?   Right Ear: External ear normal.  ?   Left Ear: External ear normal.  ?   Nose: Nose normal.  ?Eyes:  ?   General: No scleral icterus. ?   Conjunctiva/sclera: Conjunctivae normal.  ?Cardiovascular:  ?   Rate and Rhythm: Normal rate and regular rhythm.  ?   Heart sounds: Normal heart sounds. No murmur heard. ?Pulmonary:  ?   Effort: Pulmonary effort is normal. No respiratory distress.  ?   Breath sounds: Normal breath sounds.  ?Abdominal:  ?   Palpations: Abdomen is soft.  ?Musculoskeletal:  ?   Cervical back: Normal range of motion.  ?Skin: ?   General: Skin is warm and dry.  ?Neurological:  ?   General: No focal deficit present.  ?   Mental Status: She is alert and oriented to person, place, and time.  ?   Comments: She is able to get up from the chair and is able to ambulate on her own but is unsteady  ?Psychiatric:     ?   Mood and Affect: Mood normal.     ?   Behavior: Behavior normal.     ?   Thought Content: Thought content normal.     ?   Judgment: Judgment normal.  ?  ? ? ?No results found for any visits on 02/05/22. ? Assessment & Plan  ?  ? ?1. Essential (primary) hypertension ?Good blood pressure control.  On amlodipine ?- Lipid panel ?- TSH ?- CBC w/Diff/Platelet ?- Comprehensive Metabolic Panel (CMET) ? ?2. Hypercholesteremia ?On pravastatin ?- Lipid panel ?- TSH ?- CBC w/Diff/Platelet ?- Comprehensive Metabolic Panel (CMET) ? ?3. CAD in native artery ?All risk factors treated ?- Lipid panel ?- TSH ?- CBC w/Diff/Platelet ?- Comprehensive Metabolic Panel (CMET) ? ?4. Artificial  cardiac pacemaker ?Followed by cardiology ?- Lipid panel ?- TSH ?- CBC w/Diff/Platelet ?- Comprehensive Metabolic Panel (CMET) ? ?5. Obstructive sleep apnea syndrome ? ?- Lipid panel ?- TSH ?- CBC w/Diff/Platelet ?- Comprehensive Metabolic Panel (CMET) ? ?6. Arthritis ? ? ?7. Gait disturbance ?Refer to physical therapy. ?Fall risk is discussed with patient and her daughter-in-law. ? ?8. DDD (degenerative disc disease), cervical ? ? ?9. Nocturia ?UA is negative.  They have made simple and advise just to not drink any fluids after dinner at night. ?I do not think it would be worth the side effects of oxybutynin.  I do not think that would be indicated at this time. ? ? ?  No follow-ups on file.  ?   ? ?I, Wilhemena Durie, MD, have reviewed all documentation for this visit. The documentation on 02/07/22 for the exam, diagnosis, procedures, and orders are all accurate and complete. ? ? ? ?Aviyah Swetz Cranford Mon, MD  ?[facility] ?[phone] (phone) ?[phone] (fax) ? ?[facility] ?

## 2022-02-05 ENCOUNTER — Other Ambulatory Visit: Payer: Self-pay

## 2022-02-05 ENCOUNTER — Ambulatory Visit (INDEPENDENT_AMBULATORY_CARE_PROVIDER_SITE_OTHER): Payer: Medicare Other | Admitting: Family Medicine

## 2022-02-05 VITALS — BP 130/78 | HR 84 | Temp 98.3°F | Wt 126.0 lb

## 2022-02-05 DIAGNOSIS — M1990 Unspecified osteoarthritis, unspecified site: Secondary | ICD-10-CM

## 2022-02-05 DIAGNOSIS — R351 Nocturia: Secondary | ICD-10-CM | POA: Diagnosis not present

## 2022-02-05 DIAGNOSIS — M5030 Other cervical disc degeneration, unspecified cervical region: Secondary | ICD-10-CM

## 2022-02-05 DIAGNOSIS — I2510 Atherosclerotic heart disease of native coronary artery without angina pectoris: Secondary | ICD-10-CM | POA: Diagnosis not present

## 2022-02-05 DIAGNOSIS — E7800 Pure hypercholesterolemia, unspecified: Secondary | ICD-10-CM | POA: Diagnosis not present

## 2022-02-05 DIAGNOSIS — Z950 Presence of cardiac pacemaker: Secondary | ICD-10-CM | POA: Diagnosis not present

## 2022-02-05 DIAGNOSIS — R269 Unspecified abnormalities of gait and mobility: Secondary | ICD-10-CM

## 2022-02-05 DIAGNOSIS — I10 Essential (primary) hypertension: Secondary | ICD-10-CM | POA: Diagnosis not present

## 2022-02-05 DIAGNOSIS — G4733 Obstructive sleep apnea (adult) (pediatric): Secondary | ICD-10-CM | POA: Diagnosis not present

## 2022-02-05 NOTE — Patient Instructions (Signed)
Stop hydrochlorothiazide ?No fluids after supper. ?Okay to use topical Voltaren gel for the knee ?

## 2022-02-06 DIAGNOSIS — Z20822 Contact with and (suspected) exposure to covid-19: Secondary | ICD-10-CM | POA: Diagnosis not present

## 2022-02-06 LAB — COMPREHENSIVE METABOLIC PANEL
ALT: 12 IU/L (ref 0–32)
AST: 19 IU/L (ref 0–40)
Albumin/Globulin Ratio: 1.9 (ref 1.2–2.2)
Albumin: 4.4 g/dL (ref 3.5–4.6)
Alkaline Phosphatase: 86 IU/L (ref 44–121)
BUN/Creatinine Ratio: 18 (ref 12–28)
BUN: 11 mg/dL (ref 10–36)
Bilirubin Total: 0.5 mg/dL (ref 0.0–1.2)
CO2: 26 mmol/L (ref 20–29)
Calcium: 9.7 mg/dL (ref 8.7–10.3)
Chloride: 101 mmol/L (ref 96–106)
Creatinine, Ser: 0.6 mg/dL (ref 0.57–1.00)
Globulin, Total: 2.3 g/dL (ref 1.5–4.5)
Glucose: 108 mg/dL — ABNORMAL HIGH (ref 70–99)
Potassium: 4.1 mmol/L (ref 3.5–5.2)
Sodium: 139 mmol/L (ref 134–144)
Total Protein: 6.7 g/dL (ref 6.0–8.5)
eGFR: 85 mL/min/{1.73_m2} (ref >59)

## 2022-02-06 LAB — LIPID PANEL
Chol/HDL Ratio: 2.3 ratio (ref 0.0–4.4)
Cholesterol, Total: 172 mg/dL (ref 100–199)
HDL: 74 mg/dL (ref >39)
LDL Chol Calc (NIH): 75 mg/dL (ref 0–99)
Triglycerides: 137 mg/dL (ref 0–149)
VLDL Cholesterol Cal: 23 mg/dL (ref 5–40)

## 2022-02-06 LAB — CBC WITH DIFFERENTIAL/PLATELET
Basophils Absolute: 0 10*3/uL (ref 0.0–0.2)
Basos: 1 %
EOS (ABSOLUTE): 0.1 10*3/uL (ref 0.0–0.4)
Eos: 1 %
Hematocrit: 40.9 % (ref 34.0–46.6)
Hemoglobin: 13.6 g/dL (ref 11.1–15.9)
Immature Grans (Abs): 0 10*3/uL (ref 0.0–0.1)
Immature Granulocytes: 0 %
Lymphocytes Absolute: 1.7 10*3/uL (ref 0.7–3.1)
Lymphs: 23 %
MCH: 32.5 pg (ref 26.6–33.0)
MCHC: 33.3 g/dL (ref 31.5–35.7)
MCV: 98 fL — ABNORMAL HIGH (ref 79–97)
Monocytes Absolute: 0.8 10*3/uL (ref 0.1–0.9)
Monocytes: 11 %
Neutrophils Absolute: 4.6 10*3/uL (ref 1.4–7.0)
Neutrophils: 64 %
Platelets: 288 10*3/uL (ref 150–450)
RBC: 4.19 x10E6/uL (ref 3.77–5.28)
RDW: 11.9 % (ref 11.7–15.4)
WBC: 7.2 10*3/uL (ref 3.4–10.8)

## 2022-02-06 LAB — TSH: TSH: 1.09 u[IU]/mL (ref 0.450–4.500)

## 2022-02-12 ENCOUNTER — Telehealth: Payer: Self-pay | Admitting: Family Medicine

## 2022-02-12 NOTE — Telephone Encounter (Signed)
Patient notified of lab results:  ?BP reading reviewed- see lab note ?Patient is concerned about the number of times she is up at night- is there something she can take? ?Patient also wanted to know if referral for PT has been done. ?Patient advised would send messages for review and response. ? ?

## 2022-02-12 NOTE — Telephone Encounter (Signed)
Pt called in for lab results. Please call back ?

## 2022-02-14 ENCOUNTER — Telehealth: Payer: Self-pay | Admitting: Family Medicine

## 2022-02-14 NOTE — Telephone Encounter (Signed)
Copied from [place] [id number]. Topic: Medicare AWV ?>> Feb 14, 2022  2:21 PM Cher Nakai R wrote: ?Reason for CRM:  ?Left message for patient to call back and schedule Medicare Annual Wellness Visit (AWV) in office.  ? ?If unable to come into the office for AWV,  please offer to do virtually or by telephone. ? ?Last AWV: 10/04/2020 ? ?Please schedule at anytime with [place]. ? ?30 minute appointment for Virtual or phone ?45 minute appointment for in office or Initial virtual/phone ? ?Any questions, please contact me at [phone] ?

## 2022-02-15 ENCOUNTER — Ambulatory Visit: Payer: Medicare Other | Admitting: Family Medicine

## 2022-02-16 ENCOUNTER — Telehealth: Payer: Self-pay | Admitting: *Deleted

## 2022-02-16 NOTE — Telephone Encounter (Signed)
Copied from [place] [id number]. Topic: General - Other ?>> Feb 15, 2022  5:00 PM Tessa Lerner A wrote: ?Reason for CRM: The patient's daughter in law would like to speak with a member of staff when possible ? ?The patient's daughter in law was directed to call back and provide a list of the patient's medications  ? ?Please contact further when possible ?

## 2022-02-19 NOTE — Telephone Encounter (Signed)
Returned call to [employer] and updated pt's med list. ?

## 2022-02-23 DIAGNOSIS — Z20822 Contact with and (suspected) exposure to covid-19: Secondary | ICD-10-CM | POA: Diagnosis not present

## 2022-03-05 DIAGNOSIS — Z20822 Contact with and (suspected) exposure to covid-19: Secondary | ICD-10-CM | POA: Diagnosis not present

## 2022-03-14 DIAGNOSIS — R051 Acute cough: Secondary | ICD-10-CM | POA: Diagnosis not present

## 2022-03-14 DIAGNOSIS — Z20822 Contact with and (suspected) exposure to covid-19: Secondary | ICD-10-CM | POA: Diagnosis not present

## 2022-03-14 DIAGNOSIS — R059 Cough, unspecified: Secondary | ICD-10-CM | POA: Diagnosis not present

## 2022-03-19 ENCOUNTER — Telehealth: Payer: Self-pay | Admitting: Family Medicine

## 2022-03-19 NOTE — Telephone Encounter (Signed)
Copied from [place] [id number]. Topic: Medicare AWV ?>> Mar 19, 2022  2:10 PM Cher Nakai R wrote: ?Reason for CRM:  ?Left message for patient to call back and schedule Medicare Annual Wellness Visit (AWV) in office.  ? ?If unable to come into the office for AWV,  please offer to do virtually or by telephone. ? ?Last AWV: 11/02/2020 ? ?Please schedule at anytime with [facility] Health Advisor. ? ?30 minute appointment for Virtual or phone ?45 minute appointment for in office or Initial virtual/phone ? ?Any questions, please contact me at [phone] ?

## 2022-03-19 NOTE — Telephone Encounter (Signed)
Copied from [employer]. Topic: Medicare AWV ?>> Mar 19, 2022  1:49 PM Cher Nakai R wrote: ?Reason for CRM:  ?Left message for patient to call back and schedule Medicare Annual Wellness Visit (AWV) in office.  ? ?If unable to come into the office for AWV,  please offer to do virtually or by telephone. ? ?Last AWV:  10/04/2020 ? ?Please schedule at anytime with [facility] Health Advisor. ? ?30 minute appointment for Virtual or phone ?45 minute appointment for in office or Initial virtual/phone ? ?Any questions, please contact me at [phone] ?

## 2022-03-26 DIAGNOSIS — Z20822 Contact with and (suspected) exposure to covid-19: Secondary | ICD-10-CM | POA: Diagnosis not present

## 2022-04-09 ENCOUNTER — Ambulatory Visit: Payer: Medicare Other | Admitting: Family Medicine

## 2022-05-25 IMAGING — CR DG PELVIS 1-2V
1 series · 1 of 1 positions shown · non-contrast
Comparison: None.

CLINICAL DATA: Status post fall.

EXAM:
PELVIS - 1-2 VIEW

[dg pelvis 1-2 views]
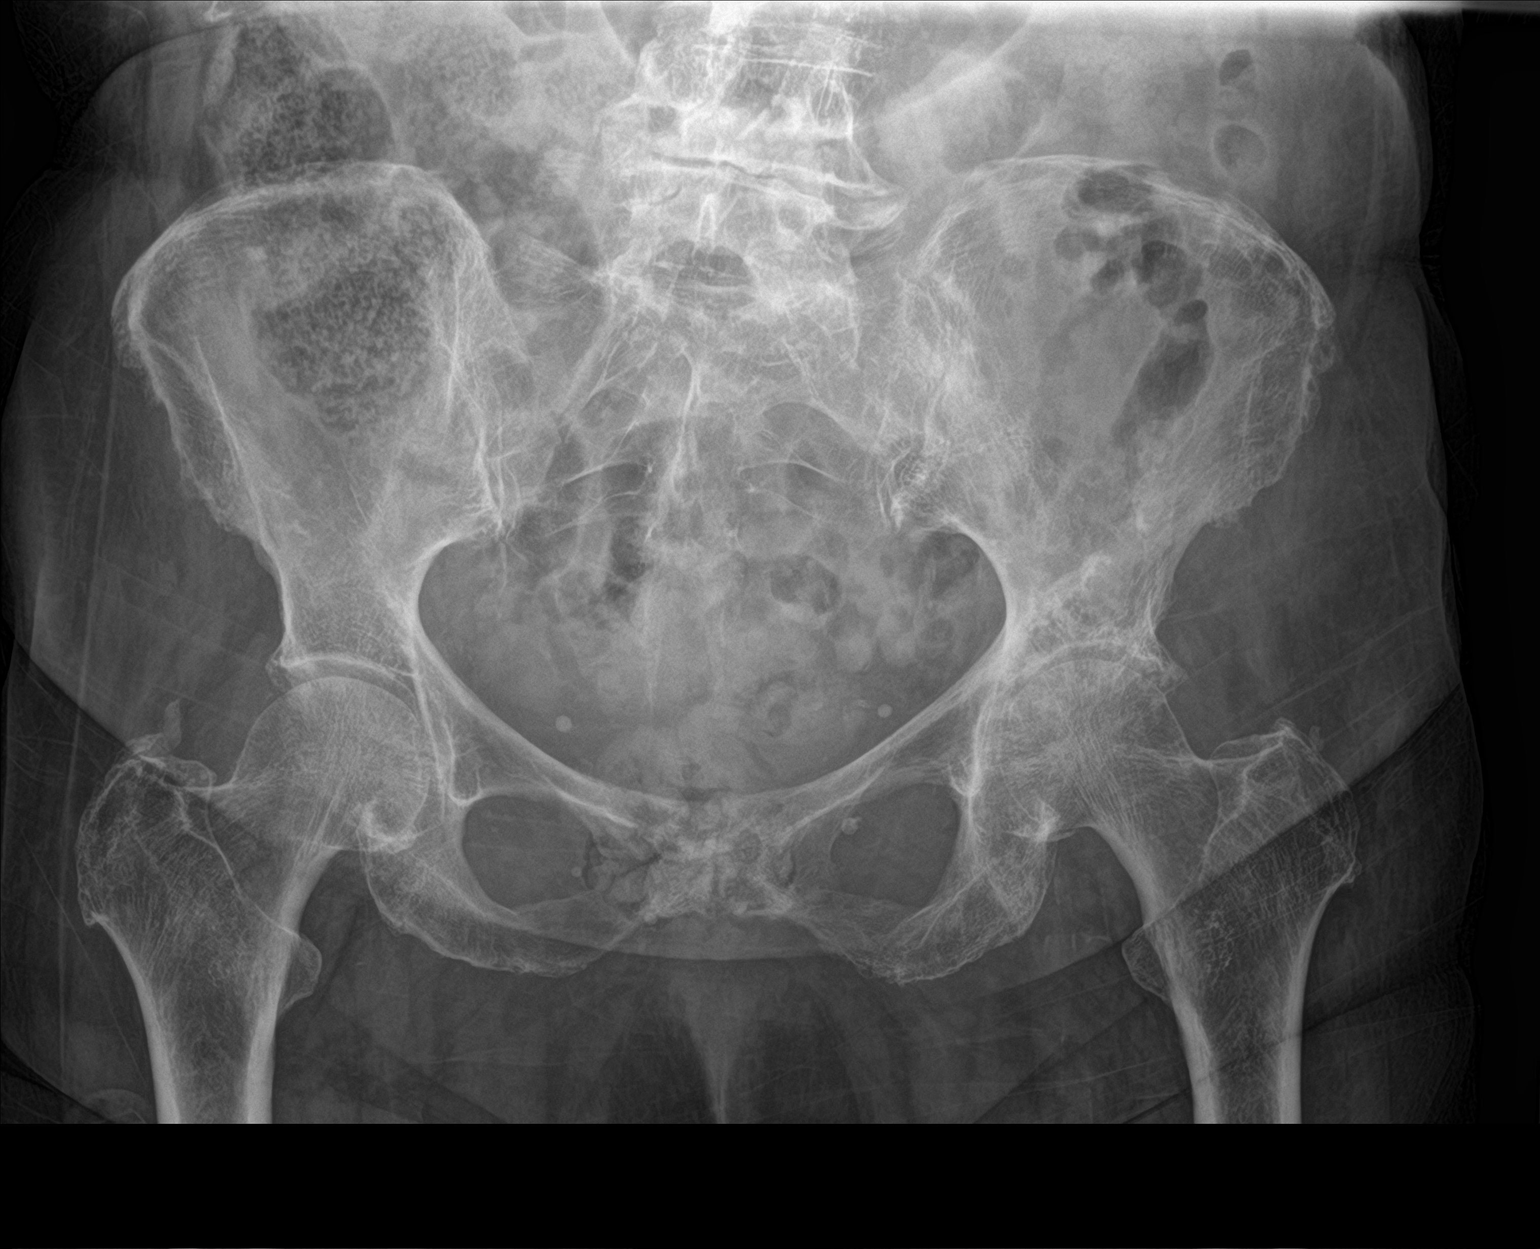

[1 of 1 positions shown; findings below may reference images not displayed]

FINDINGS: There is no evidence of pelvic fracture or diastasis. Moderate
severity degenerative changes seen involving both hips and the
visualized portion of the lower lumbar spine. No pelvic bone lesions
are seen.
IMPRESSION: Degenerative changes without evidence of an acute osseous
abnormality.

## 2022-05-25 IMAGING — CR DG HUMERUS 2V *L*
1 series · 5 of 5 positions shown · non-contrast
Comparison: September 21, 2015

CLINICAL DATA: Status post fall.

EXAM:
LEFT HUMERUS - 2+ VIEW

[Series 1: dg humerus left · 0.14mm/px · 5 of 5 slices shown]
[im 1/5]
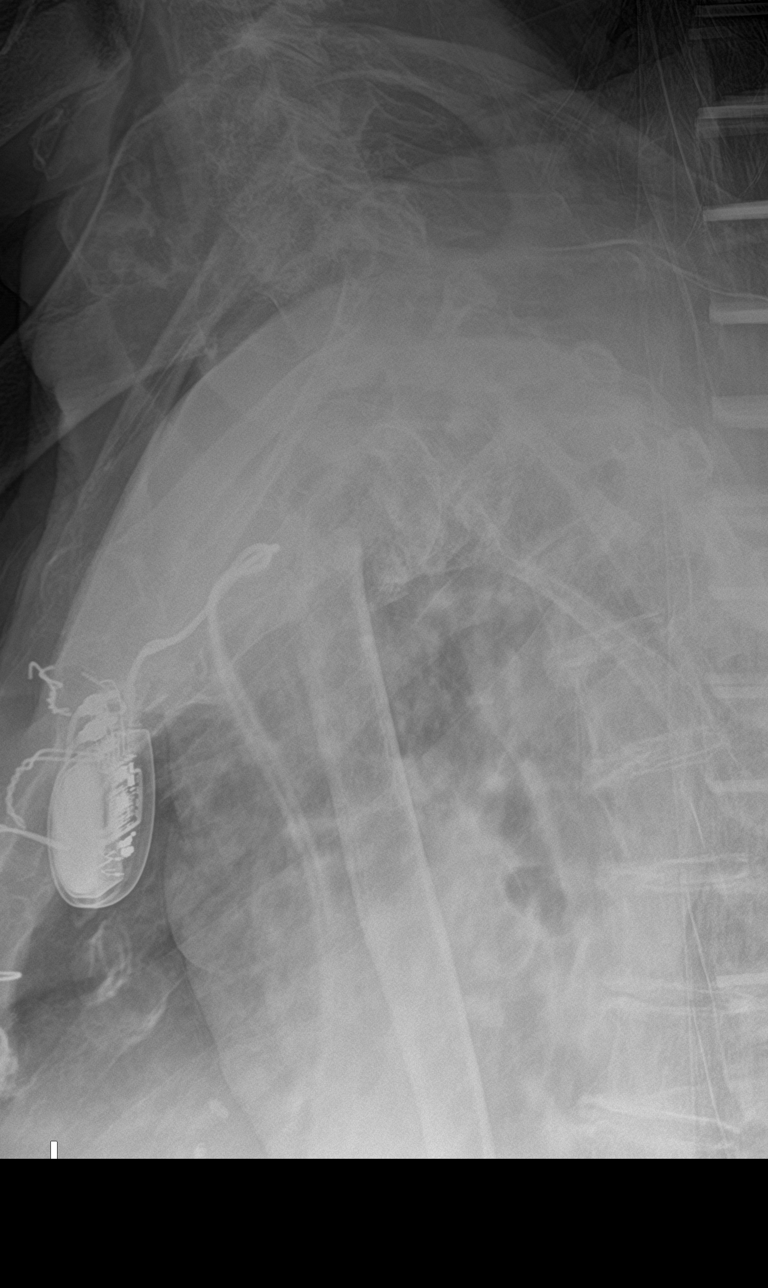
[im 2/5]
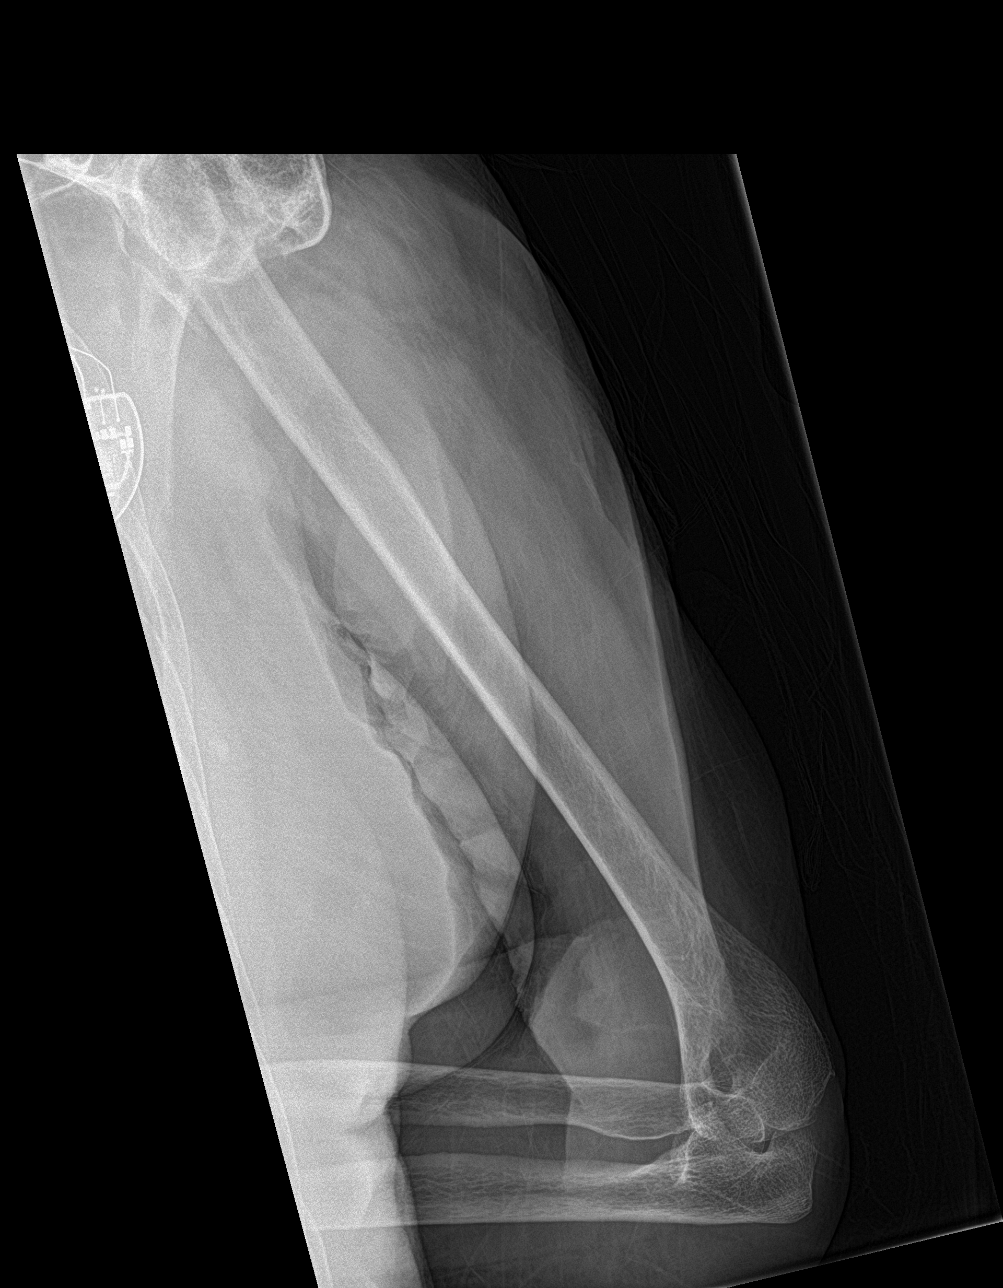
[im 3/5]
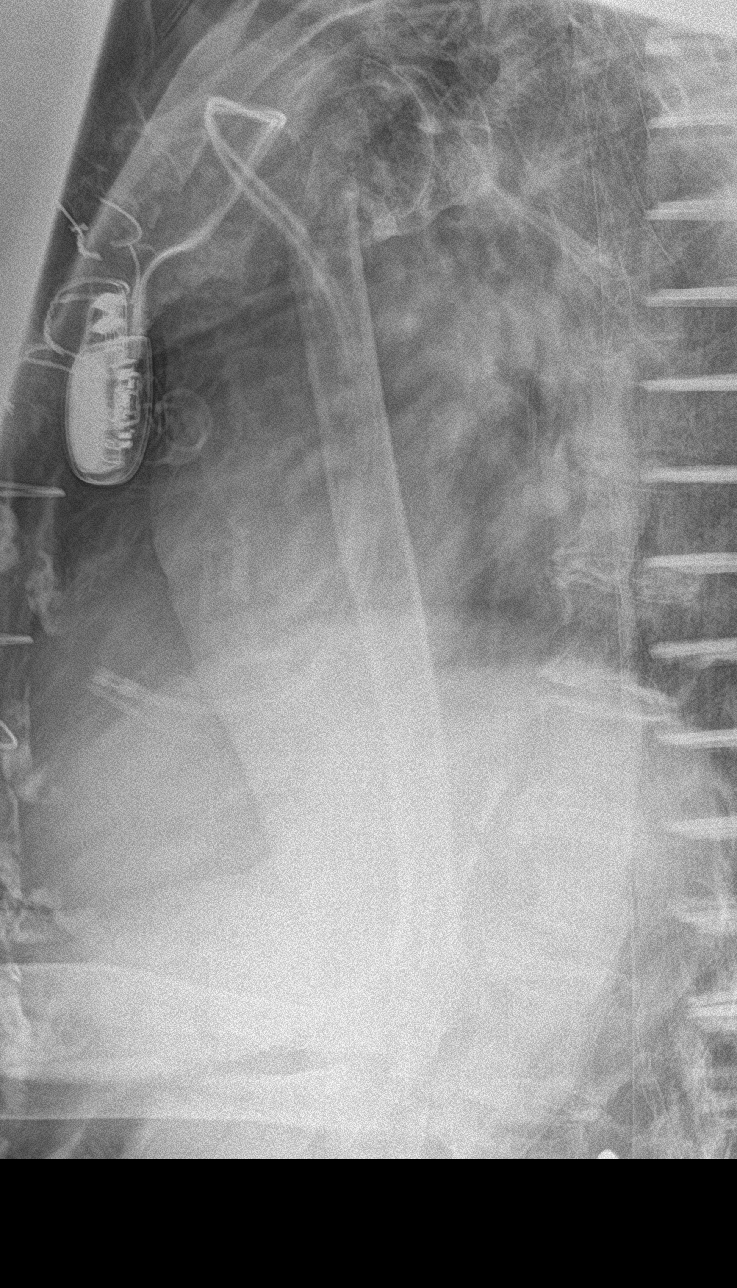
[im 4/5]
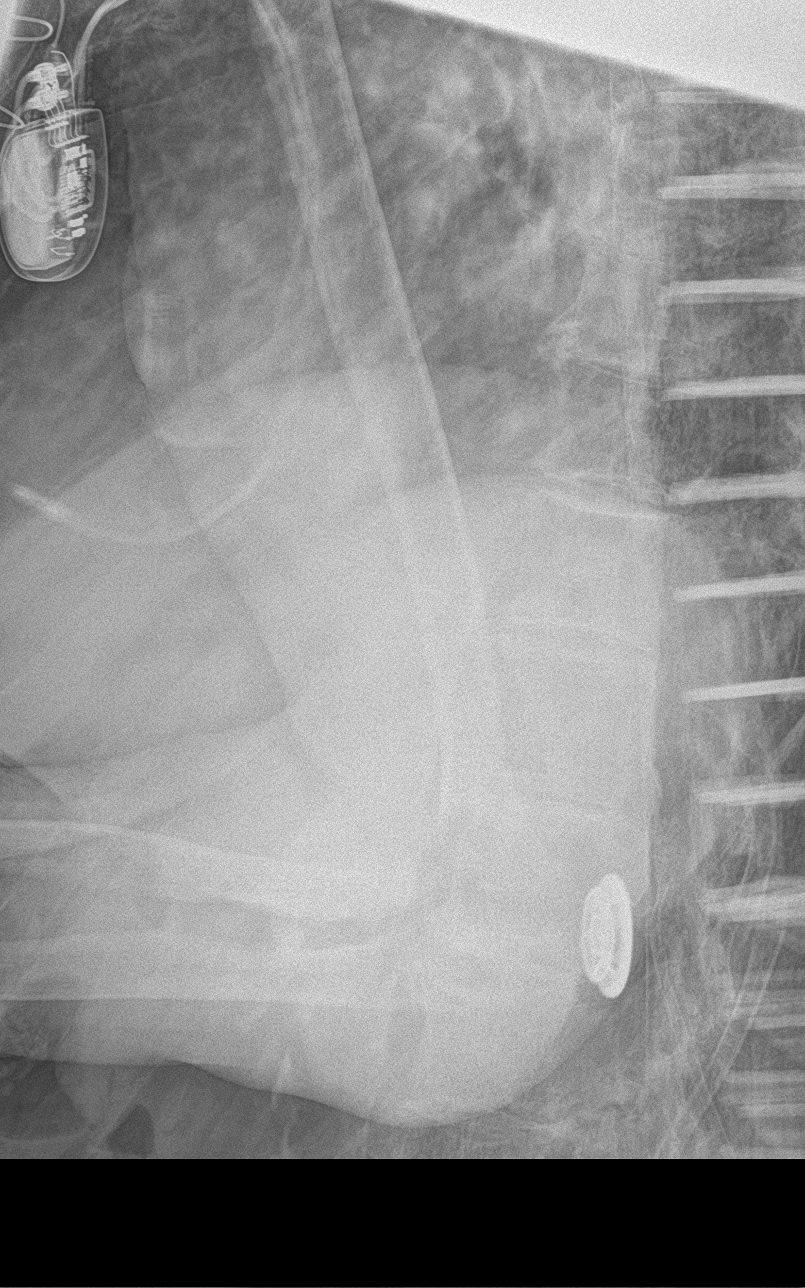
[im 5/5]
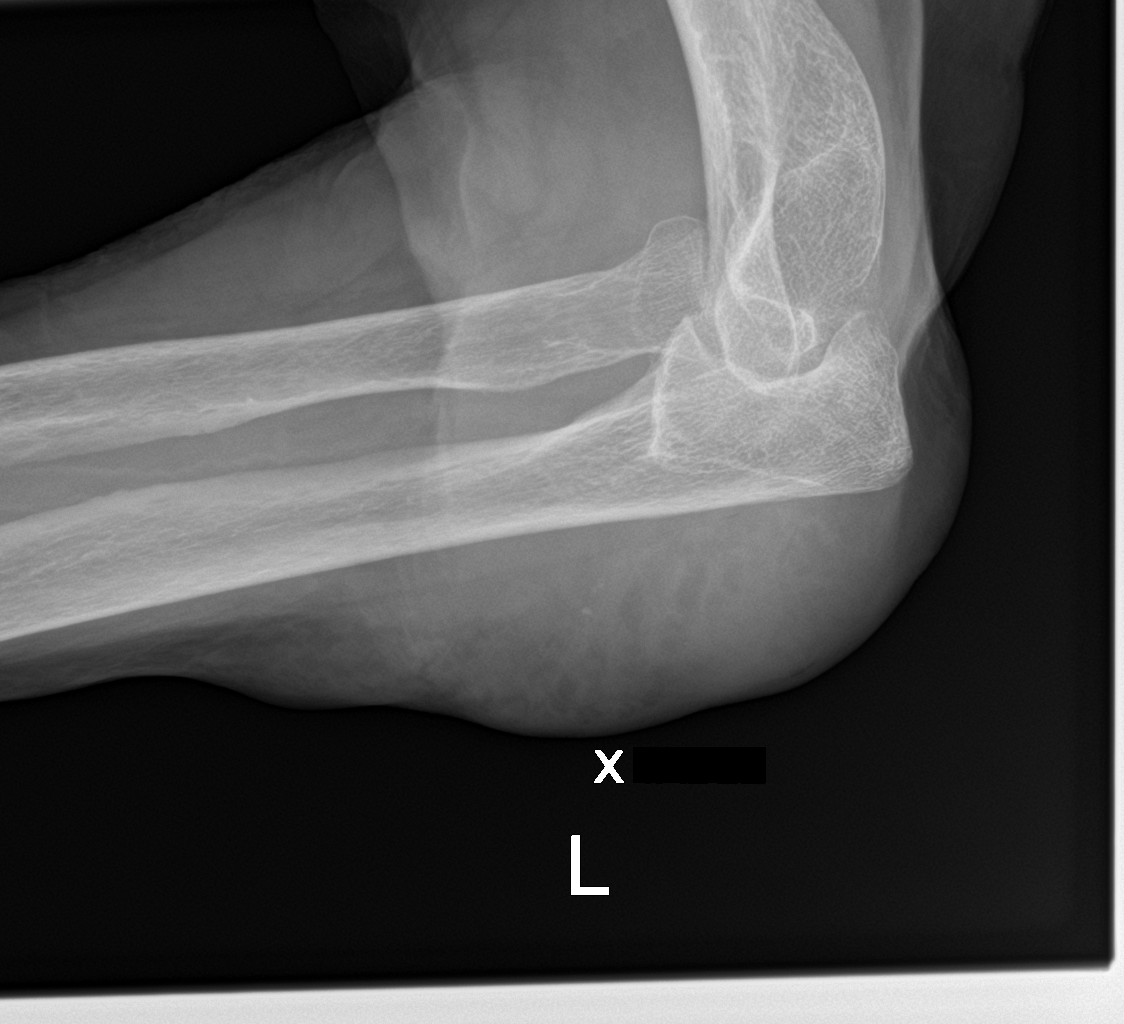

[5 of 5 positions shown; findings below may reference images not displayed]

FINDINGS: A chronic, partially impacted fracture is seen involving the head
and neck of the proximal left humerus. An additional fracture of
indeterminate age is seen along the medial aspect of the proximal
left humerus, within the expected region of the surgical neck. This
is not clearly identified on the prior study. Marked severity soft
tissue swelling is seen involving the visualized portion of the
elbow. This is most prominent along the dorsal aspect of the
proximal left ulna.
IMPRESSION: 1. Chronic, partially impacted fracture involving the head and neck
of the proximal left humerus.
2. Additional suspected fracture of the proximal left humerus of
indeterminate age. CT correlation is recommended.
3. Marked severity soft tissue swelling along the dorsal aspect of
the left elbow and proximal left forearm.

## 2022-07-18 ENCOUNTER — Ambulatory Visit (INDEPENDENT_AMBULATORY_CARE_PROVIDER_SITE_OTHER): Payer: Medicare Other

## 2022-07-18 ENCOUNTER — Telehealth: Payer: Self-pay | Admitting: Family Medicine

## 2022-07-18 VITALS — Wt 126.0 lb

## 2022-07-18 DIAGNOSIS — Z0000 Encounter for general adult medical examination without abnormal findings: Secondary | ICD-10-CM

## 2022-07-18 NOTE — Patient Instructions (Signed)
Melanie Franklin , Thank you for taking time to come for your Medicare Wellness Visit. I appreciate your ongoing commitment to your health goals. Please review the following plan we discussed and let me know if I can assist you in the future.   Screening recommendations/referrals: Colonoscopy: aged out Mammogram: aged out Bone Density: aged out Recommended yearly ophthalmology/optometry visit for glaucoma screening and checkup Recommended yearly dental visit for hygiene and checkup  Vaccinations: Influenza vaccine: 01/15/22 Pneumococcal vaccine: 10/04/14 Tdap vaccine: 02/09/04, due if have injury Shingles vaccine: n/d   Covid-19:10/25/20  Advanced directives: no  Conditions/risks identified: none  Next appointment: Follow up in one year for your annual wellness visit 07/23/23 @ 10:45 am by phone   Preventive Care 86 Years and Older, Female Preventive care refers to lifestyle choices and visits with your health care provider that can promote health and wellness. What does preventive care include? A yearly physical exam. This is also called an annual well check. Dental exams once or twice a year. Routine eye exams. Ask your health care provider how often you should have your eyes checked. Personal lifestyle choices, including: Daily care of your teeth and gums. Regular physical activity. Eating a healthy diet. Avoiding tobacco and drug use. Limiting alcohol use. Practicing safe sex. Taking low-dose aspirin every day. Taking vitamin and mineral supplements as recommended by your health care provider. What happens during an annual well check? The services and screenings done by your health care provider during your annual well check will depend on your age, overall health, lifestyle risk factors, and family history of disease. Counseling  Your health care provider may ask you questions about your: Alcohol use. Tobacco use. Drug use. Emotional well-being. Home and relationship  well-being. Sexual activity. Eating habits. History of falls. Memory and ability to understand (cognition). Work and work Statistician. Reproductive health. Screening  You may have the following tests or measurements: Height, weight, and BMI. Blood pressure. Lipid and cholesterol levels. These may be checked every 5 years, or more frequently if you are over 10 years old. Skin check. Lung cancer screening. You may have this screening every year starting at age 86 if you have a 30-pack-year history of smoking and currently smoke or have quit within the past 15 years. Fecal occult blood test (FOBT) of the stool. You may have this test every year starting at age 86. Flexible sigmoidoscopy or colonoscopy. You may have a sigmoidoscopy every 5 years or a colonoscopy every 10 years starting at age 86. Hepatitis C blood test. Hepatitis B blood test. Sexually transmitted disease (STD) testing. Diabetes screening. This is done by checking your blood sugar (glucose) after you have not eaten for a while (fasting). You may have this done every 1-3 years. Bone density scan. This is done to screen for osteoporosis. You may have this done starting at age 86. Mammogram. This may be done every 1-2 years. Talk to your health care provider about how often you should have regular mammograms. Talk with your health care provider about your test results, treatment options, and if necessary, the need for more tests. Vaccines  Your health care provider may recommend certain vaccines, such as: Influenza vaccine. This is recommended every year. Tetanus, diphtheria, and acellular pertussis (Tdap, Td) vaccine. You may need a Td booster every 10 years. Zoster vaccine. You may need this after age 86. Pneumococcal 13-valent conjugate (PCV13) vaccine. One dose is recommended after age 86. Pneumococcal polysaccharide (PPSV23) vaccine. One dose is recommended after age 86.  Talk to your health care provider about which  screenings and vaccines you need and how often you need them. This information is not intended to replace advice given to you by your health care provider. Make sure you discuss any questions you have with your health care provider. Document Released: 12/02/2015 Document Revised: 07/25/2016 Document Reviewed: 09/06/2015 Elsevier Interactive Patient Education  2017 [place] Prevention in the Home Falls can cause injuries. They can happen to people of all ages. There are many things you can do to make your home safe and to help prevent falls. What can I do on the outside of my home? Regularly fix the edges of walkways and driveways and fix any cracks. Remove anything that might make you trip as you walk through a door, such as a raised step or threshold. Trim any bushes or trees on the path to your home. Use bright outdoor lighting. Clear any walking paths of anything that might make someone trip, such as rocks or tools. Regularly check to see if handrails are loose or broken. Make sure that both sides of any steps have handrails. Any raised decks and porches should have guardrails on the edges. Have any leaves, snow, or ice cleared regularly. Use sand or salt on walking paths during winter. Clean up any spills in your garage right away. This includes oil or grease spills. What can I do in the bathroom? Use night lights. Install grab bars by the toilet and in the tub and shower. Do not use towel bars as grab bars. Use non-skid mats or decals in the tub or shower. If you need to sit down in the shower, use a plastic, non-slip stool. Keep the floor dry. Clean up any water that spills on the floor as soon as it happens. Remove soap buildup in the tub or shower regularly. Attach bath mats securely with double-sided non-slip rug tape. Do not have throw rugs and other things on the floor that can make you trip. What can I do in the bedroom? Use night lights. Make sure that you have a  light by your bed that is easy to reach. Do not use any sheets or blankets that are too big for your bed. They should not hang down onto the floor. Have a firm chair that has side arms. You can use this for support while you get dressed. Do not have throw rugs and other things on the floor that can make you trip. What can I do in the kitchen? Clean up any spills right away. Avoid walking on wet floors. Keep items that you use a lot in easy-to-reach places. If you need to reach something above you, use a strong step stool that has a grab bar. Keep electrical cords out of the way. Do not use floor polish or wax that makes floors slippery. If you must use wax, use non-skid floor wax. Do not have throw rugs and other things on the floor that can make you trip. What can I do with my stairs? Do not leave any items on the stairs. Make sure that there are handrails on both sides of the stairs and use them. Fix handrails that are broken or loose. Make sure that handrails are as long as the stairways. Check any carpeting to make sure that it is firmly attached to the stairs. Fix any carpet that is loose or worn. Avoid having throw rugs at the top or bottom of the stairs. If you do have throw  rugs, attach them to the floor with carpet tape. Make sure that you have a light switch at the top of the stairs and the bottom of the stairs. If you do not have them, ask someone to add them for you. What else can I do to help prevent falls? Wear shoes that: Do not have high heels. Have rubber bottoms. Are comfortable and fit you well. Are closed at the toe. Do not wear sandals. If you use a stepladder: Make sure that it is fully opened. Do not climb a closed stepladder. Make sure that both sides of the stepladder are locked into place. Ask someone to hold it for you, if possible. Clearly mark and make sure that you can see: Any grab bars or handrails. First and last steps. Where the edge of each step  is. Use tools that help you move around (mobility aids) if they are needed. These include: Canes. Walkers. Scooters. Crutches. Turn on the lights when you go into a dark area. Replace any light bulbs as soon as they burn out. Set up your furniture so you have a clear path. Avoid moving your furniture around. If any of your floors are uneven, fix them. If there are any pets around you, be aware of where they are. Review your medicines with your doctor. Some medicines can make you feel dizzy. This can increase your chance of falling. Ask your doctor what other things that you can do to help prevent falls. This information is not intended to replace advice given to you by your health care provider. Make sure you discuss any questions you have with your health care provider. Document Released: 09/01/2009 Document Revised: 04/12/2016 Document Reviewed: 12/10/2014 Elsevier Interactive Patient Education  2017 [employer].

## 2022-07-18 NOTE — Progress Notes (Signed)
Virtual Visit via Telephone Note  I connected with  Melanie Franklin on 07/18/22 at  2:00 PM EDT by telephone and verified that I am speaking with the correct person using two identifiers.  Location: Patient: home Provider: [facility] Persons participating in the virtual visit: [place]   I discussed the limitations, risks, security and privacy concerns of performing an evaluation and management service by telephone and the availability of in person appointments. The patient expressed understanding and agreed to proceed.  Interactive audio and video telecommunications were attempted between this nurse and patient, however failed, due to patient having technical difficulties OR patient did not have access to video capability.  We continued and completed visit with audio only.  Some vital signs may be absent or patient reported.   Dionisio David, LPN  Subjective:   Melanie Franklin is a 88 y.o. female who presents for Medicare Annual (Subsequent) preventive examination.  Review of Systems     Cardiac Risk Factors include: advanced age (>31mn, >>35women);dyslipidemia;hypertension     Objective:    There were no vitals filed for this visit. There is no height or weight on file to calculate BMI.     07/18/2022    2:08 PM 04/26/2020    9:30 PM 07/29/2018   10:21 AM 04/22/2018   10:19 AM 07/12/2017   11:21 AM 06/28/2017   11:09 AM 03/21/2017    9:44 AM  Advanced Directives  Does Patient Have a Medical Advance Directive? No Yes No Yes Yes Yes Yes  Type of Advance Directive  [place] Does patient want to make changes to medical advance directive?     No - Patient declined No - Patient declined Yes (ED - Information included in AVS)  Copy of HMacyin Chart?    No - copy requested No - copy requested No -  copy requested Yes  Would patient like information on creating a medical advance directive? No - Patient declined          Current Medications (verified) Outpatient Encounter Medications as of 07/18/2022  Medication Sig   acetaminophen (TYLENOL) 160 MG/5ML suspension Take 1 tablet by mouth 2 (two) times daily.   acetaminophen (TYLENOL) 650 MG CR tablet Take 650 mg by mouth 2 (two) times daily. 1 tablet in the morning and 1 at bedtime   amLODipine (NORVASC) 5 MG tablet Take 1 tablet (5 mg total) by mouth daily.   aspirin 81 MG tablet Take 81 mg by mouth daily.    CALCIUM PO Take by mouth.   diclofenac Sodium (VOLTAREN) 1 % GEL Apply topically 4 (four) times daily.   docusate sodium (COLACE) 100 MG capsule Take 1 capsule every day by oral route.   magnesium oxide (MAG-OX) 400 MG tablet Take 400 mg by mouth daily.    MULTIPLE VITAMINS PO Take 1 tablet by mouth daily.    Multiple Vitamins-Minerals (ONE DAILY CALCIUM/IRON PO) Take 1 tablet by mouth daily.   omeprazole (PRILOSEC) 20 MG capsule Take 1 capsule (20 mg total) by mouth daily.   OVER THE COUNTER MEDICATION Apply 1 application topically as needed (muscle and joint  pain). Horse Liniment Topical Analgesic 4% Natural Menthol   pravastatin (PRAVACHOL) 40 MG tablet Take 1 tablet (40 mg total) by mouth daily.   traMADol (ULTRAM) 50 MG tablet    hydrochlorothiazide (  HYDRODIURIL) 25 MG tablet  (Patient not taking: Reported on 07/18/2022)   HYDROcodone-acetaminophen (NORCO/VICODIN) 5-325 MG tablet 1 PO Q 4hrs prn pain (Patient not taking: Reported on 07/18/2022)   ondansetron (ZOFRAN) 4 MG tablet Take 1 tablet twice a day by oral route. (Patient not taking: Reported on 07/18/2022)   oxyCODONE-acetaminophen (PERCOCET/ROXICET) 5-325 MG tablet  (Patient not taking: Reported on 07/18/2022)   sulfamethoxazole-trimethoprim (BACTRIM DS) 800-160 MG tablet sulfamethoxazole 800 mg-trimethoprim 160 mg tablet (Patient not taking: Reported on 07/18/2022)   No  facility-administered encounter medications on file as of 07/18/2022.    Allergies (verified) Ciprofloxacin, Meloxicam, Tramadol, Amoxicillin, and Entex lq  [phenylephrine-guaifenesin]   History: Past Medical History:  Diagnosis Date   Coronary artery disease    Family history of adverse reaction to anesthesia    "sister had a reaction to meds given during a heart cath that almost killed her."   GERD (gastroesophageal reflux disease)    Hypertension    Presence of permanent cardiac pacemaker    Past Surgical History:  Procedure Laterality Date   CARDIAC CATHETERIZATION     CATARACT EXTRACTION Bilateral    CORONARY ARTERY BYPASS GRAFT  03/09/2009   x 4 [place] ARTHROSCOPY Left    LASIK     PACEMAKER INSERTION     PACEMAKER INSERTION Left 07/12/2017   Procedure: PACEMAKER CHANGE OUT;  Surgeon: Marzetta Board, MD;  Location: [facility] ORS;  Service: Cardiovascular;  Laterality: Left;   TONSILLECTOMY     Family History  Problem Relation Age of Onset   Hypertension Mother    Colon cancer Mother    Cerebrovascular Accident Mother    Ulcers Mother    Other Mother        nervous breakdown   Stroke Father    Congestive Heart Failure Sister    Kidney Stones Sister    Cancer Sister        oral   Hypertension Brother    Hypertension Sister    Diabetes Sister    CAD Sister    Other Sister    Breast cancer Neg Hx    Social History   Socioeconomic History   Marital status: Widowed    Spouse name: Not on file   Number of children: 2   Years of education: Not on file   Highest education level: 12th grade  Occupational History   Not on file  Tobacco Use   Smoking status: Never   Smokeless tobacco: Never  Vaping Use   Vaping Use: Never used  Substance and Sexual Activity   Alcohol use: No   Drug use: No   Sexual activity: Not on file  Other Topics Concern   Not on file  Social History Narrative   Not on file    Social Determinants of Health   Financial Resource Strain: Low Risk  (07/18/2022)   Overall Financial Resource Strain (CARDIA)    Difficulty of Paying Living Expenses: Not hard at all  Food Insecurity: No Food Insecurity (07/18/2022)   Hunger Vital Sign    Worried About Running Out of Food in the Last Year: Never true    Ran Out of Food in the Last Year: Never true  Transportation Needs: No Transportation Needs (07/18/2022)   PRAPARE - Hydrologist (Medical): No    Lack of Transportation (Non-Medical): No  Physical Activity: Insufficiently Active (07/18/2022)   Exercise Vital Sign  Days of Exercise per Week: 2 days    Minutes of Exercise per Session: 20 min  Stress: No Stress Concern Present (07/18/2022)   [place]    Feeling of Stress : Not at all  Social Connections: Socially Isolated (07/18/2022)   Social Connection and Isolation Panel [NHANES]    Frequency of Communication with Friends and Family: More than three times a week    Frequency of Social Gatherings with Friends and Family: Once a week    Attends Religious Services: Never    Marine scientist or Organizations: No    Attends Archivist Meetings: Never    Marital Status: Widowed    Tobacco Counseling Counseling given: Not Answered   Clinical Intake:  Pre-visit preparation completed: Yes  Pain : No/denies pain     Nutritional Risks: None Diabetes: No  How often do you need to have someone help you when you read instructions, pamphlets, or other written materials from your doctor or pharmacy?: 1 - Never  Diabetic?no  Interpreter Needed?: No  Information entered by :: Kirke Shaggy, LPN   Activities of Daily Living    07/18/2022    2:11 PM 01/15/2022   11:31 AM  In your present state of health, do you have any difficulty performing the following activities:  Hearing? 0 0  Vision? 0 1   Difficulty concentrating or making decisions? 0 0  Walking or climbing stairs? 1   Dressing or bathing? 0 0  Doing errands, shopping? 0 0  Preparing Food and eating ? N   Using the Toilet? N   In the past six months, have you accidently leaked urine? N   Do you have problems with loss of bowel control? N   Managing your Medications? N   Managing your Finances? N   Housekeeping or managing your Housekeeping? N     Patient Care Team: Jerrol Banana., MD as PCP - General (Family Medicine) Dingeldein, Remo Lipps, MD as Consulting Physician (Ophthalmology) Corey Skains, MD as Consulting Physician (Cardiology)  Indicate any recent Medical Services you may have received from other than [facility] providers in the past year (date may be approximate).     Assessment:   This is a routine wellness examination for [place].  Hearing/Vision screen Hearing Screening - Comments:: No aids Vision Screening - Comments:: Wears glasses- [facility]  Dietary issues and exercise activities discussed: Current Exercise Habits: Home exercise routine, Type of exercise: walking, Time (Minutes): 20, Frequency (Times/Week): 2, Weekly Exercise (Minutes/Week): 40, Intensity: Mild   Goals Addressed             This Visit's Progress    DIET - EAT MORE FRUITS AND VEGETABLES        Depression Screen    07/18/2022    2:01 PM 01/15/2022   11:31 AM 10/04/2020   10:20 AM 03/27/2019    1:58 PM 04/22/2018   10:20 AM 03/21/2017    9:51 AM 03/21/2017    9:48 AM  PHQ 2/9 Scores  PHQ - 2 Score 0 0  0 '1 1 1  '$ PHQ- 9 Score '1 6    1   '$ Exception Documentation   Patient refusal        Fall Risk    07/18/2022    2:10 PM 01/15/2022   11:30 AM 10/04/2020   10:14 AM 09/30/2019   10:20 AM 04/22/2018   10:20 AM  Fall Risk   Falls  in the past year? 0 0 1 0 No  Number falls in past yr: 0  1 0   Injury with Fall? 0 1 1 0   Risk for fall due to : No Fall Risks   History of fall(s)   Follow up Falls prevention discussed   Falls evaluation completed Falls evaluation completed     [place]:  Any stairs in or around the home? Yes  If so, are there any without handrails? No  Home free of loose throw rugs in walkways, pet beds, electrical cords, etc? Yes  Adequate lighting in your home to reduce risk of falls? Yes   ASSISTIVE DEVICES UTILIZED TO PREVENT FALLS:  Life alert? Yes  Use of a cane, walker or w/c? Yes  Grab bars in the bathroom? Yes  Shower chair or bench in shower? Yes  Elevated toilet seat or a handicapped toilet? Yes    Cognitive Function:        07/18/2022    2:13 PM 03/21/2017    9:52 AM  6CIT Screen  What Year? 0 points 0 points  What month? 0 points 0 points  What time? 0 points 0 points  Count back from 20 0 points 0 points  Months in reverse 2 points 0 points  Repeat phrase 6 points 6 points  Total Score 8 points 6 points    Immunizations Immunization History  Administered Date(s) Administered   Fluad Quad(high Dose 65+) 09/30/2019, 09/13/2020, 01/15/2022   Influenza Whole 08/14/2016, 08/19/2017   Influenza, High Dose Seasonal PF 08/01/2015, 08/23/2018   PFIZER(Purple Top)SARS-COV-2 Vaccination 10/25/2020   Pneumococcal Conjugate-13 10/04/2014   Pneumococcal Polysaccharide-23 09/22/2005   Td 02/09/2004   Zoster, Live 08/09/2008    TDAP status: Due, Education has been provided regarding the importance of this vaccine. Advised may receive this vaccine at local pharmacy or Health Dept. Aware to provide a copy of the vaccination record if obtained from local pharmacy or Health Dept. Verbalized acceptance and understanding.  Flu Vaccine status: Up to date  Pneumococcal vaccine status: Up to date  Covid-19 vaccine status: Declined, Education has been provided regarding the importance of this vaccine but patient still declined. Advised may receive this vaccine at local pharmacy or Health Dept.or vaccine clinic. Aware to provide a copy of the  vaccination record if obtained from local pharmacy or Health Dept. Verbalized acceptance and understanding.  Qualifies for Shingles Vaccine? Yes   Zostavax completed Yes   Shingrix Completed?: No.    Education has been provided regarding the importance of this vaccine. Patient has been advised to call insurance company to determine out of pocket expense if they have not yet received this vaccine. Advised may also receive vaccine at local pharmacy or Health Dept. Verbalized acceptance and understanding.  Screening Tests Health Maintenance  Topic Date Due   Zoster Vaccines- Shingrix (1 of 2) Never done   COVID-19 Vaccine (2 - Pfizer series) 12/20/2020   INFLUENZA VACCINE  06/19/2022   TETANUS/TDAP  11/19/2026 (Originally 02/08/2014)   Pneumonia Vaccine 49+ Years old  Completed   DEXA SCAN  Completed   HPV VACCINES  Aged Out    Health Maintenance  Health Maintenance Due  Topic Date Due   Zoster Vaccines- Shingrix (1 of 2) Never done   COVID-19 Vaccine (2 - Pfizer series) 12/20/2020   INFLUENZA VACCINE  06/19/2022    Colorectal cancer screening: No longer required.   Mammogram status: No longer required due to age.  Lung Cancer Screening: (Low Dose CT Chest recommended if Age 22-80 years, 30 pack-year currently smoking OR have quit w/in 15years.) does not qualify.   Additional Screening:  Hepatitis C Screening: does not qualify; Completed NO  Vision Screening: Recommended annual ophthalmology exams for early detection of glaucoma and other disorders of the eye. Is the patient up to date with their annual eye exam?  Yes  Who is the provider or what is the name of the office in which the patient attends annual eye exams? [place] If pt is not established with a provider, would they like to be referred to a provider to establish care? No .   Dental Screening: Recommended annual dental exams for proper oral hygiene  Community Resource Referral / Chronic Care Management: CRR  required this visit?  No   CCM required this visit?  No      Plan:     I have personally reviewed and noted the following in the patient's chart:   Medical and social history Use of alcohol, tobacco or illicit drugs  Current medications and supplements including opioid prescriptions. Patient is not currently taking opioid prescriptions. Functional ability and status Nutritional status Physical activity Advanced directives List of other physicians Hospitalizations, surgeries, and ER visits in previous 12 months Vitals Screenings to include cognitive, depression, and falls Referrals and appointments  In addition, I have reviewed and discussed with patient certain preventive protocols, quality metrics, and best practice recommendations. A written personalized care plan for preventive services as well as general preventive health recommendations were provided to patient.     Dionisio David, LPN   [id number]   Nurse Notes: none

## 2022-07-18 NOTE — Telephone Encounter (Signed)
Copied from [place] [id number]. Topic: Medicare AWV >> Jul 18, 2022  9:19 AM Jae Dire wrote: Reason for CRM:  Left message for patient to call back and schedule Medicare Annual Wellness Visit (AWV) in office.   If unable to come into the office for AWV,  please offer to do virtually or by telephone.  Last AWV: 10/04/2020  Please schedule at anytime with [facility] Health Advisor.  30 minute appointment for Virtual or phone 45 minute appointment for in office or Initial virtual/phone  Any questions, please contact me at [phone]

## 2022-10-14 DIAGNOSIS — M1711 Unilateral primary osteoarthritis, right knee: Secondary | ICD-10-CM | POA: Diagnosis not present

## 2022-10-29 DIAGNOSIS — M5416 Radiculopathy, lumbar region: Secondary | ICD-10-CM | POA: Diagnosis not present

## 2022-10-29 DIAGNOSIS — M5451 Vertebrogenic low back pain: Secondary | ICD-10-CM | POA: Diagnosis not present

## 2023-01-08 ENCOUNTER — Other Ambulatory Visit: Payer: Self-pay | Admitting: General Practice

## 2023-01-08 DIAGNOSIS — K219 Gastro-esophageal reflux disease without esophagitis: Secondary | ICD-10-CM

## 2023-01-08 DIAGNOSIS — E7800 Pure hypercholesterolemia, unspecified: Secondary | ICD-10-CM

## 2023-01-08 NOTE — Telephone Encounter (Unsigned)
Copied from [place] [id number]. Topic: General - Other >> Jan 08, 2023 11:16 AM Everette C wrote: Reason for CRM: Medication Refill - Medication: pravastatin (PRAVACHOL) 40 MG tablet [id number]  omeprazole (PRILOSEC) 20 MG capsule [id number]  Has the patient contacted their pharmacy? Yes.   (Agent: If no, request that the patient contact the pharmacy for the refill. If patient does not wish to contact the pharmacy document the reason why and proceed with request.) (Agent: If yes, when and what did the pharmacy advise?)  Preferred Pharmacy (with phone number or street name): [facility] [id number], [place] [address] Phone: [phone] Fax: [phone] Hours: Not open 24 hours   Has the patient been seen for an appointment in the last year OR does the patient have an upcoming appointment? Yes.    Agent: Please be advised that RX refills may take up to 3 business days. We ask that you follow-up with your pharmacy.

## 2023-01-09 MED ORDER — PRAVASTATIN SODIUM 40 MG PO TABS: 40.0000 mg | ORAL_TABLET | Freq: Every day | ORAL | 0 refills | Status: DC

## 2023-01-09 MED ORDER — OMEPRAZOLE 20 MG PO CPDR: 20.0000 mg | DELAYED_RELEASE_CAPSULE | Freq: Every day | ORAL | 3 refills | Status: DC

## 2023-01-09 NOTE — Telephone Encounter (Signed)
Requested Prescriptions  Pending Prescriptions Disp Refills   omeprazole (PRILOSEC) 20 MG capsule 90 capsule 3    Sig: Take 1 capsule (20 mg total) by mouth daily.     Gastroenterology: Proton Pump Inhibitors Passed - 01/08/2023 12:34 PM      Passed - Valid encounter within last 12 months    Recent Outpatient Visits           11 months ago Essential (primary) hypertension   [place] Eulas Post, MD   11 months ago Essential (primary) hypertension   [place] Mikey Kirschner, PA-C   2 years ago Encounter for [employer] annual wellness exam   [facility] Eulas Post, MD   3 years ago Encounter for [employer] annual wellness exam   [facility] Eulas Post, MD   4 years ago CAD in native artery   [facility] Eulas Post, MD       Future Appointments             In 2 days Simmons-Robinson, Riki Sheer, MD [facility]             pravastatin (PRAVACHOL) 40 MG tablet 90 tablet 0    Sig: Take 1 tablet (40 mg total) by mouth daily.     Cardiovascular:  Antilipid - Statins Failed - 01/08/2023 12:34 PM      Failed - Lipid Panel in normal range within the last 12 months    Cholesterol, Total  Date Value Ref Range Status  02/05/2022 172 100 - 199 mg/dL Final   LDL Chol Calc (NIH)  Date Value Ref Range Status  02/05/2022 75 0 - 99 mg/dL Final   HDL  Date Value Ref Range Status  02/05/2022 74 >39 mg/dL Final   Triglycerides  Date Value Ref Range Status  02/05/2022 137 0 - 149 mg/dL Final         Passed - Patient is not pregnant      Passed - Valid encounter within last 12 months    Recent Outpatient Visits           11 months ago Essential (primary) hypertension   [place] Eulas Post, MD   11 months ago Essential (primary) hypertension    [place] Mikey Kirschner, PA-C   2 years ago Encounter for [employer] annual wellness exam   [facility] Eulas Post, MD   3 years ago Encounter for [employer] annual wellness exam   [facility] Eulas Post, MD   4 years ago CAD in native artery   [place], MD       Future Appointments             In 2 days Simmons-Robinson, Riki Sheer, MD [facility], [place]

## 2023-01-10 NOTE — Progress Notes (Unsigned)
I,[name],acting as a scribe for [employer], MD.,have documented all relevant documentation on the behalf of Melanie Foster, MD,as directed by  Melanie Foster, MD while in the presence of Melanie Foster, MD.   Established patient visit   Patient: Melanie Franklin   DOB: Oct 02, 1929   89 y.o. Female  MRN: [id number] Visit Date: 01/11/2023  Today's healthcare provider: [name], MD   No chief complaint on file.  Subjective    HPI  Hypertension, follow-up  BP Readings from Last 3 Encounters:  02/05/22 130/78  01/15/22 (!) 190/66  10/04/20 (!) 177/75   Wt Readings from Last 3 Encounters:  07/18/22 126 lb (57.2 kg)  02/05/22 126 lb (57.2 kg)  01/15/22 126 lb 12.8 oz (57.5 kg)     Patient reports on Quinipril/HCTZ 20/12.5 mg but is not on her medications and is not on our list. Doesn't know why. She stop the Amlodipine 5 mg due to dizziness. She reports excellent compliance with treatment.  Outside blood pressures are not being checked. She states that she has been experiencing a headache for the last few weeks up to 1 month, denies changes to her visit, denies chest discomfort  Denies unilateral weakness  She reports that her blood pressure was elevated  She has an upcoming cardiology appt  She has not been taking any blood pressures for a while  She states that she does not feel like herself and has felt unsteady on her feet  She lives alone and wears a lifeline device   Lipid/Cholesterol, follow-up  Last Lipid Panel: Lab Results  Component Value Date   CHOL 172 02/05/2022   [place] 75 02/05/2022   HDL 74 02/05/2022   TRIG 137 02/05/2022   Patient currently on Pravastatin 40 mg She reports excellent compliance with treatment.  Last metabolic panel Lab Results  Component Value Date   GLUCOSE 108 (H) 02/05/2022   NA 139 02/05/2022   K 4.1 02/05/2022   BUN 11 02/05/2022   CREATININE 0.60  02/05/2022   EGFR 85 02/05/2022   GFRNONAA 86 10/04/2020   CALCIUM 9.7 02/05/2022   AST 19 02/05/2022   ALT 12 02/05/2022   The ASCVD Risk score (Arnett DK, et al., 2019) failed to calculate for the following reasons:   The 2019 ASCVD risk score is only valid for ages 95 to 13  ---------------------------------------------------------------------------------------------------   Arthritis  She used to take Tramadol  She has been taking extra strength tyenol twice daily and it usually helps to relieve the pain    Medications: Outpatient Medications Prior to Visit  Medication Sig   acetaminophen (TYLENOL) 160 MG/5ML suspension Take 1 tablet by mouth 2 (two) times daily.   acetaminophen (TYLENOL) 650 MG CR tablet Take 650 mg by mouth 2 (two) times daily. 1 tablet in the morning and 1 at bedtime   amLODipine (NORVASC) 5 MG tablet Take 1 tablet (5 mg total) by mouth daily.   aspirin 81 MG tablet Take 81 mg by mouth daily.    CALCIUM PO Take by mouth.   diclofenac Sodium (VOLTAREN) 1 % GEL Apply topically 4 (four) times daily.   docusate sodium (COLACE) 100 MG capsule Take 1 capsule every day by oral route.   hydrochlorothiazide (HYDRODIURIL) 25 MG tablet  (Patient not taking: Reported on 07/18/2022)   HYDROcodone-acetaminophen (NORCO/VICODIN) 5-325 MG tablet 1 PO Q 4hrs prn pain (Patient not taking: Reported on 07/18/2022)   magnesium oxide (MAG-OX) 400 MG tablet Take 400  mg by mouth daily.    MULTIPLE VITAMINS PO Take 1 tablet by mouth daily.    Multiple Vitamins-Minerals (ONE DAILY CALCIUM/IRON PO) Take 1 tablet by mouth daily.   omeprazole (PRILOSEC) 20 MG capsule Take 1 capsule (20 mg total) by mouth daily.   ondansetron (ZOFRAN) 4 MG tablet Take 1 tablet twice a day by oral route. (Patient not taking: Reported on 07/18/2022)   OVER THE COUNTER MEDICATION Apply 1 application topically as needed (muscle and joint  pain). Horse Liniment Topical Analgesic 4% Natural Menthol    oxyCODONE-acetaminophen (PERCOCET/ROXICET) 5-325 MG tablet  (Patient not taking: Reported on 07/18/2022)   pravastatin (PRAVACHOL) 40 MG tablet Take 1 tablet (40 mg total) by mouth daily.   sulfamethoxazole-trimethoprim (BACTRIM DS) 800-160 MG tablet sulfamethoxazole 800 mg-trimethoprim 160 mg tablet (Patient not taking: Reported on 07/18/2022)   traMADol (ULTRAM) 50 MG tablet    No facility-administered medications prior to visit.    Review of Systems  {Labs  Heme  Chem  Endocrine  Serology  Results Review (optional):23779}   Objective    There were no vitals taken for this visit. {Show previous vital signs (optional):23777}  Physical Exam Vitals reviewed.  Constitutional:      General: She is not in acute distress.    Appearance: Normal appearance. She is not ill-appearing, toxic-appearing or diaphoretic.  Eyes:     Conjunctiva/sclera: Conjunctivae normal.  Cardiovascular:     Rate and Rhythm: Normal rate and regular rhythm.     Pulses: Normal pulses.     Heart sounds: Normal heart sounds. No murmur heard.    No friction rub. No gallop.  Pulmonary:     Effort: Pulmonary effort is normal. No respiratory distress.     Breath sounds: Normal breath sounds. No stridor. No wheezing, rhonchi or rales.  Abdominal:     General: Bowel sounds are normal. There is no distension.     Palpations: Abdomen is soft.     Tenderness: There is no abdominal tenderness.  Musculoskeletal:     Right lower leg: No edema.     Left lower leg: No edema.  Skin:    Findings: No erythema or rash.  Neurological:     Mental Status: She is alert and oriented to person, place, and time.     ***  No results found for any visits on 01/11/23.  Assessment & Plan     Problem List Items Addressed This Visit   None    No follow-ups on file.        The entirety of the information documented in the History of Present Illness, Review of Systems and Physical Exam were personally obtained by me.  Portions of this information were initially documented by *** . I, Melanie Foster, MD have reviewed the documentation above for thoroughness and accuracy.      Melanie Foster, MD  [facility] [phone] (phone) [phone] (fax)  [place]

## 2023-01-11 ENCOUNTER — Encounter: Payer: Self-pay | Admitting: Family Medicine

## 2023-01-11 ENCOUNTER — Ambulatory Visit (INDEPENDENT_AMBULATORY_CARE_PROVIDER_SITE_OTHER): Payer: Medicare Other | Admitting: Family Medicine

## 2023-01-11 VITALS — BP 176/52 | HR 94 | Temp 98.4°F | Resp 16 | Wt 121.0 lb

## 2023-01-11 DIAGNOSIS — E7800 Pure hypercholesterolemia, unspecified: Secondary | ICD-10-CM

## 2023-01-11 DIAGNOSIS — I10 Essential (primary) hypertension: Secondary | ICD-10-CM

## 2023-01-11 MED ORDER — AMLODIPINE BESYLATE 2.5 MG PO TABS: 2.5000 mg | ORAL_TABLET | Freq: Every morning | ORAL | 0 refills | Status: DC

## 2023-01-11 NOTE — Patient Instructions (Addendum)
For your elevated blood pressure, Please start taking amlodipine 2.'5mg'$  daily and check your blood pressure 1-2 hours after your medication

## 2023-01-13 NOTE — Assessment & Plan Note (Signed)
Chronic  BP is elevated, uncontrolled  Will add amlodipine 2.'5mg'$  daily  Recommended patient check BP 1-2 hours after medication  She will follow up in 10 days

## 2023-01-13 NOTE — Assessment & Plan Note (Signed)
Chronic  Stable  She will continue pravastatin '40mg'$  daily

## 2023-01-18 NOTE — Progress Notes (Unsigned)
Established patient visit   Patient: Melanie Franklin   DOB: 1928/12/18   89 y.o. Female  MRN: [id number] Visit Date: 01/21/2023  Today's healthcare provider: [name], MD   No chief complaint on file.  Subjective    HPI   Hypertension, follow-up  BP Readings from Last 3 Encounters:  01/21/23 (!) 152/84  01/11/23 (!) 176/52  02/05/22 130/78   Wt Readings from Last 3 Encounters:  01/21/23 117 lb 14.4 oz (53.5 kg)  01/11/23 121 lb (54.9 kg)  07/18/22 126 lb (57.2 kg)     She was last seen for hypertension 1 weeks ago.  BP at that visit was 176/52. Management since that visit includes addition of amlodipine 2.'5mg'$  daily and monitoring home BP with follow up in 10 days.  She reports good compliance with treatment. She is not having side effects.  She is following a Regular diet. She is not exercising. She does not smoke.  Use of agents associated with hypertension: none.   Outside blood pressures are being checked at home. Symptoms: No chest pain No chest pressure  No palpitations No syncope  No dyspnea No orthopnea  No paroxysmal nocturnal dyspnea No lower extremity edema   Pertinent labs Lab Results  Component Value Date   CHOL 172 02/05/2022   HDL 74 02/05/2022   LDLCALC 75 02/05/2022   TRIG 137 02/05/2022   CHOLHDL 2.3 02/05/2022   Lab Results  Component Value Date   NA 139 02/05/2022   K 4.1 02/05/2022   CREATININE 0.60 02/05/2022   EGFR 85 02/05/2022   GLUCOSE 108 (H) 02/05/2022   TSH 1.090 02/05/2022     The ASCVD Risk score (Arnett DK, et al., 2019) failed to calculate for the following reasons:   The 2019 ASCVD risk score is only valid for ages 58 to 61  ---------------------------------------------------------------------------------------------------    Medications: Outpatient Medications Prior to Visit  Medication Sig   acetaminophen (TYLENOL) 160 MG/5ML suspension Take 1 tablet by mouth 2 (two) times daily.    acetaminophen (TYLENOL) 650 MG CR tablet Take 650 mg by mouth 2 (two) times daily. 1 tablet in the morning and 1 at bedtime   amLODipine (NORVASC) 2.5 MG tablet Take 1 tablet (2.5 mg total) by mouth in the morning.   aspirin 81 MG tablet Take 81 mg by mouth daily.    CALCIUM PO Take by mouth.   diclofenac Sodium (VOLTAREN) 1 % GEL Apply topically 4 (four) times daily.   docusate sodium (COLACE) 100 MG capsule Take 1 capsule every day by oral route.   hydrochlorothiazide (HYDRODIURIL) 25 MG tablet    magnesium oxide (MAG-OX) 400 MG tablet Take 400 mg by mouth daily.    MULTIPLE VITAMINS PO Take 1 tablet by mouth daily.    Multiple Vitamins-Minerals (ONE DAILY CALCIUM/IRON PO) Take 1 tablet by mouth daily.   omeprazole (PRILOSEC) 20 MG capsule Take 1 capsule (20 mg total) by mouth daily.   ondansetron (ZOFRAN) 4 MG tablet    OVER THE COUNTER MEDICATION Apply 1 application topically as needed (muscle and joint  pain). Horse Liniment Topical Analgesic 4% Natural Menthol   pravastatin (PRAVACHOL) 40 MG tablet Take 1 tablet (40 mg total) by mouth daily.   traMADol (ULTRAM) 50 MG tablet    No facility-administered medications prior to visit.    Review of Systems     Objective    BP (!) 152/84 (BP Location: Right Arm, Patient Position: Sitting, Cuff Size: Normal)  Pulse 80   Temp 98.2 F (36.8 C) (Oral)   Resp 12   Ht '4\' 10"'$  (1.473 m)   Wt 117 lb 14.4 oz (53.5 kg)   SpO2 100%   BMI 24.64 kg/m    Physical Exam Vitals reviewed.  Constitutional:      General: She is not in acute distress.    Appearance: Normal appearance. She is not ill-appearing, toxic-appearing or diaphoretic.  Eyes:     Conjunctiva/sclera: Conjunctivae normal.  Neck:     Thyroid: No thyroid mass, thyromegaly or thyroid tenderness.     Vascular: No carotid bruit.  Cardiovascular:     Rate and Rhythm: Normal rate and regular rhythm.     Pulses: Normal pulses.     Heart sounds: Normal heart sounds. No murmur  heard.    No friction rub. No gallop.  Pulmonary:     Effort: Pulmonary effort is normal. No respiratory distress.     Breath sounds: Normal breath sounds. No stridor. No wheezing, rhonchi or rales.  Abdominal:     General: Bowel sounds are normal. There is no distension.     Palpations: Abdomen is soft.     Tenderness: There is no abdominal tenderness.  Musculoskeletal:     Right lower leg: No edema.     Left lower leg: No edema.  Lymphadenopathy:     Cervical: No cervical adenopathy.  Skin:    Findings: No erythema or rash.  Neurological:     Mental Status: She is alert and oriented to person, place, and time.       No results found for any visits on 01/21/23.  Assessment & Plan     Problem List Items Addressed This Visit       Cardiovascular and Mediastinum   Essential (primary) hypertension - Primary    BP elevated above goal  Recommended that she continue to measure home BP movements  Continue amlodipine 2.'5mg'$  daily and hctz '25mg'$          Other   Balance problem    Chronic issue  Patient requires assistive device to ambulate  States that she often walks through her home holding on to furniture to prevent falling  Reports feeling unsteady om her feet  Order placed for homehealth PT ue to safety concerns with walking       Relevant Orders   Home Health   Face-to-face encounter (required for Medicare/Medicaid patients)     Return in about 6 weeks (around 03/04/2023) for balance, HTN .        The entirety of the information documented in the History of Present Illness, Review of Systems and Physical Exam were personally obtained by me. Portions of this information were initially documented by Lyndel Pleasure, CMA . I, [name], MD have reviewed the documentation above for thoroughness and accuracy.   [name], MD  [facility] [phone] (phone) [phone] (fax)  [place]

## 2023-01-21 ENCOUNTER — Ambulatory Visit (INDEPENDENT_AMBULATORY_CARE_PROVIDER_SITE_OTHER): Payer: Medicare Other | Admitting: Family Medicine

## 2023-01-21 ENCOUNTER — Encounter: Payer: Self-pay | Admitting: Family Medicine

## 2023-01-21 VITALS — BP 152/84 | HR 80 | Temp 98.2°F | Resp 12 | Ht <= 58 in | Wt 117.9 lb

## 2023-01-21 DIAGNOSIS — R2689 Other abnormalities of gait and mobility: Secondary | ICD-10-CM | POA: Diagnosis not present

## 2023-01-21 DIAGNOSIS — I10 Essential (primary) hypertension: Secondary | ICD-10-CM

## 2023-01-21 NOTE — Assessment & Plan Note (Addendum)
Chronic issue  Patient requires assistive device to ambulate  States that she often walks through her home holding on to furniture to prevent falling  Reports feeling unsteady om her feet  Order placed for homehealth PT ue to safety concerns with walking

## 2023-01-21 NOTE — Patient Instructions (Addendum)
I have placed a referral with Physical therapy for home health therapy to help with your balance concerns.    Please be on the lookout for a call to schedule this therapy.   For your headaches, you can continue the Tylenol as needed and make sure to drink 6 8-oz glasses of water daily as dehydration can contribute to headaches.   Please follow up with me in 6 weeks for your blood pressure and headaches.

## 2023-01-21 NOTE — Assessment & Plan Note (Signed)
BP elevated above goal  Recommended that she continue to measure home BP movements  Continue amlodipine 2.'5mg'$  daily and hctz '25mg'$ 

## 2023-01-25 DIAGNOSIS — I25810 Atherosclerosis of coronary artery bypass graft(s) without angina pectoris: Secondary | ICD-10-CM | POA: Diagnosis not present

## 2023-01-25 DIAGNOSIS — I071 Rheumatic tricuspid insufficiency: Secondary | ICD-10-CM | POA: Diagnosis not present

## 2023-01-25 DIAGNOSIS — I340 Nonrheumatic mitral (valve) insufficiency: Secondary | ICD-10-CM | POA: Diagnosis not present

## 2023-01-25 DIAGNOSIS — G4733 Obstructive sleep apnea (adult) (pediatric): Secondary | ICD-10-CM | POA: Diagnosis not present

## 2023-01-25 DIAGNOSIS — I10 Essential (primary) hypertension: Secondary | ICD-10-CM | POA: Diagnosis not present

## 2023-01-25 DIAGNOSIS — E782 Mixed hyperlipidemia: Secondary | ICD-10-CM | POA: Diagnosis not present

## 2023-01-25 DIAGNOSIS — I442 Atrioventricular block, complete: Secondary | ICD-10-CM | POA: Diagnosis not present

## 2023-01-25 DIAGNOSIS — I6523 Occlusion and stenosis of bilateral carotid arteries: Secondary | ICD-10-CM | POA: Diagnosis not present

## 2023-01-25 DIAGNOSIS — Z950 Presence of cardiac pacemaker: Secondary | ICD-10-CM | POA: Diagnosis not present

## 2023-02-13 DIAGNOSIS — I442 Atrioventricular block, complete: Secondary | ICD-10-CM | POA: Diagnosis not present

## 2023-02-13 DIAGNOSIS — I25810 Atherosclerosis of coronary artery bypass graft(s) without angina pectoris: Secondary | ICD-10-CM | POA: Diagnosis not present

## 2023-02-13 DIAGNOSIS — Z950 Presence of cardiac pacemaker: Secondary | ICD-10-CM | POA: Diagnosis not present

## 2023-03-04 ENCOUNTER — Ambulatory Visit: Payer: Medicare Other | Admitting: Family Medicine

## 2023-03-06 ENCOUNTER — Telehealth: Payer: Self-pay | Admitting: Physician Assistant

## 2023-03-06 ENCOUNTER — Ambulatory Visit: Payer: Self-pay

## 2023-03-06 ENCOUNTER — Encounter: Payer: Self-pay | Admitting: Physician Assistant

## 2023-03-06 ENCOUNTER — Ambulatory Visit (INDEPENDENT_AMBULATORY_CARE_PROVIDER_SITE_OTHER): Payer: Medicare Other | Admitting: Physician Assistant

## 2023-03-06 VITALS — BP 158/80 | HR 89 | Wt 117.3 lb

## 2023-03-06 DIAGNOSIS — R351 Nocturia: Secondary | ICD-10-CM | POA: Diagnosis not present

## 2023-03-06 DIAGNOSIS — M545 Low back pain, unspecified: Secondary | ICD-10-CM

## 2023-03-06 NOTE — Progress Notes (Unsigned)
I,Sha'taria Tyson,acting as a Neurosurgeon for [employer], PA-C.,have documented all relevant documentation on the behalf of Alfredia Ferguson, PA-C,as directed by  Alfredia Ferguson, PA-C while in the presence of Alfredia Ferguson, PA-C.   Established patient visit   Patient: Melanie Franklin   DOB: 06/24/1929   89 y.o. Female  MRN: [id number] Visit Date: 03/06/2023  Today's healthcare provider: Alfredia Ferguson, PA-C   Cc. Low back pain x 1 day Subjective    Back Pain This is a new problem. The current episode started yesterday. The problem occurs constantly. The problem is unchanged. The pain is present in the sacro-iliac. The quality of the pain is described as stabbing. The pain does not radiate. The pain is severe. The pain is Worse during the night. She has tried nothing for the symptoms.   Pain started last night, sharp stabbing pain on her left mid back. Denies radiation. Reports pain is relieved with urination but denies dysuria. Reports baseline nocturia.  Denies recent fall/injury.  Medications: Outpatient Medications Prior to Visit  Medication Sig   acetaminophen (TYLENOL) 160 MG/5ML suspension Take 1 tablet by mouth 2 (two) times daily.   acetaminophen (TYLENOL) 650 MG CR tablet Take 650 mg by mouth 2 (two) times daily. 1 tablet in the morning and 1 at bedtime   amLODipine (NORVASC) 2.5 MG tablet Take 1 tablet (2.5 mg total) by mouth in the morning.   aspirin 81 MG tablet Take 81 mg by mouth daily.    CALCIUM PO Take by mouth.   diclofenac Sodium (VOLTAREN) 1 % GEL Apply topically 4 (four) times daily.   docusate sodium (COLACE) 100 MG capsule Take 1 capsule every day by oral route.   hydrochlorothiazide (HYDRODIURIL) 25 MG tablet    magnesium oxide (MAG-OX) 400 MG tablet Take 400 mg by mouth daily.    MULTIPLE VITAMINS PO Take 1 tablet by mouth daily.    Multiple Vitamins-Minerals (ONE DAILY CALCIUM/IRON PO) Take 1 tablet by mouth daily.   omeprazole (PRILOSEC) 20 MG capsule Take  1 capsule (20 mg total) by mouth daily.   ondansetron (ZOFRAN) 4 MG tablet    OVER THE COUNTER MEDICATION Apply 1 application topically as needed (muscle and joint  pain). Horse Liniment Topical Analgesic 4% Natural Menthol   pravastatin (PRAVACHOL) 40 MG tablet Take 1 tablet (40 mg total) by mouth daily.   traMADol (ULTRAM) 50 MG tablet    No facility-administered medications prior to visit.    Review of Systems  Musculoskeletal:  Positive for back pain.     Objective    BP (!) 158/80 (BP Location: Left Arm, Patient Position: Sitting, Cuff Size: Normal)   Pulse 89   Wt 117 lb 4.8 oz (53.2 kg)   SpO2 97%   BMI 24.52 kg/m    Physical Exam Vitals reviewed.  Constitutional:      Appearance: She is not ill-appearing.  HENT:     Head: Normocephalic.  Eyes:     Conjunctiva/sclera: Conjunctivae normal.  Cardiovascular:     Rate and Rhythm: Normal rate.  Pulmonary:     Effort: Pulmonary effort is normal. No respiratory distress.  Musculoskeletal:     Comments: Some tenderness L flank/ lateral midback. No edema, erythema, rash.   Neurological:     General: No focal deficit present.     Mental Status: She is alert and oriented to person, place, and time.  Psychiatric:        Mood and Affect: Mood normal.  Behavior: Behavior normal.     No results found for any visits on 03/06/23.  Assessment & Plan     1. Acute left-sided low back pain without sciatica UA today -- pt unable to void . Given urine sample cup to return . Given association with urination suspicious for cystitis Ordered cbc, cmp  Advised tylenol for pain   Return if symptoms worsen or fail to improve.      I, Alfredia Ferguson, PA-C have reviewed all documentation for this visit. The documentation on  03/06/23  for the exam, diagnosis, procedures, and orders are all accurate and complete.  Alfredia Ferguson, PA-C [facility] [address] [place], [place], [address] Office:  [phone] Fax: [phone]   [facility]

## 2023-03-06 NOTE — Telephone Encounter (Signed)
     Chief Complaint: Back pain, left side at waist Symptoms: Above Frequency: Last night Pertinent Negatives: Patient denies any radiation of pain Disposition: ED /[] Urgent Care (no appt availability in office) / Appointment(In office/virtual)/  [facility]/ Home Care/ Refused Recommended Disposition /[] [facility]/  Follow-up with PCP Additional Notes: Pt. Already has appointment for today.   Answer Assessment - Initial Assessment Questions 1. ONSET: "When did the pain begin?"      Last night 2. LOCATION: "Where does it hurt?" (upper, mid or lower back)     Left side at waistline 3. SEVERITY: "How bad is the pain?"  (e.g., Scale 1-10; mild, moderate, or severe)   - MILD (1-3): Doesn't interfere with normal activities.    - MODERATE (4-7): Interferes with normal activities or awakens from sleep.    - SEVERE (8-10): Excruciating pain, unable to do any normal activities.      Now - Severe 4. PATTERN: "Is the pain constant?" (e.g., yes, no; constant, intermittent)      Constant 5. RADIATION: "Does the pain shoot into your legs or somewhere else?"     No 6. CAUSE:  "What do you think is causing the back pain?"      Unsure 7. BACK OVERUSE:  "Any recent lifting of heavy objects, strenuous work or exercise?"     No 8. MEDICINES: "What have you taken so far for the pain?" (e.g., nothing, acetaminophen, NSAIDS)     Tylenol 9. NEUROLOGIC SYMPTOMS: "Do you have any weakness, numbness, or problems with bowel/bladder control?"     No 10. OTHER SYMPTOMS: "Do you have any other symptoms?" (e.g., fever, abdomen pain, burning with urination, blood in urine)       No 11. PREGNANCY: "Is there any chance you are pregnant?" "When was your last menstrual period?"       No  Protocols used: Back Pain-A-AH

## 2023-03-06 NOTE — Telephone Encounter (Signed)
Pt and son have been advise to drop off urine sample either today or tomorrow (if tomorrow refrigerated overnight) as pt was unable to void in office and there is suspicion for UTI Culture and dip are ordered

## 2023-03-07 DIAGNOSIS — R351 Nocturia: Secondary | ICD-10-CM | POA: Diagnosis not present

## 2023-03-07 LAB — CBC WITH DIFFERENTIAL/PLATELET
Basophils Absolute: 0 10*3/uL (ref 0.0–0.2)
Basos: 1 %
EOS (ABSOLUTE): 0 10*3/uL (ref 0.0–0.4)
Eos: 0 %
Hematocrit: 38.7 % (ref 34.0–46.6)
Hemoglobin: 13.1 g/dL (ref 11.1–15.9)
Immature Grans (Abs): 0 10*3/uL (ref 0.0–0.1)
Immature Granulocytes: 0 %
Lymphocytes Absolute: 1.8 10*3/uL (ref 0.7–3.1)
Lymphs: 25 %
MCH: 32.9 pg (ref 26.6–33.0)
MCHC: 33.9 g/dL (ref 31.5–35.7)
MCV: 97 fL (ref 79–97)
Monocytes Absolute: 0.7 10*3/uL (ref 0.1–0.9)
Monocytes: 10 %
Neutrophils Absolute: 4.5 10*3/uL (ref 1.4–7.0)
Neutrophils: 64 %
Platelets: 260 10*3/uL (ref 150–450)
RBC: 3.98 x10E6/uL (ref 3.77–5.28)
RDW: 11.4 % — ABNORMAL LOW (ref 11.7–15.4)
WBC: 7 10*3/uL (ref 3.4–10.8)

## 2023-03-07 LAB — POCT URINALYSIS DIPSTICK
Bilirubin, UA: NEGATIVE
Blood, UA: NEGATIVE
Glucose, UA: NEGATIVE
Ketones, UA: NEGATIVE
Leukocytes, UA: NEGATIVE
Nitrite, UA: NEGATIVE
Protein, UA: NEGATIVE
Spec Grav, UA: 1.015 (ref 1.010–1.025)
Urobilinogen, UA: 0.2 E.U./dL
pH, UA: 6 (ref 5.0–8.0)

## 2023-03-07 LAB — COMPREHENSIVE METABOLIC PANEL
ALT: 10 IU/L (ref 0–32)
AST: 18 IU/L (ref 0–40)
Albumin/Globulin Ratio: 2.1 (ref 1.2–2.2)
Albumin: 4.5 g/dL (ref 3.6–4.6)
Alkaline Phosphatase: 75 IU/L (ref 44–121)
BUN/Creatinine Ratio: 20 (ref 12–28)
BUN: 11 mg/dL (ref 10–36)
Bilirubin Total: 0.5 mg/dL (ref 0.0–1.2)
CO2: 22 mmol/L (ref 20–29)
Calcium: 9.9 mg/dL (ref 8.7–10.3)
Chloride: 100 mmol/L (ref 96–106)
Creatinine, Ser: 0.55 mg/dL — ABNORMAL LOW (ref 0.57–1.00)
Globulin, Total: 2.1 g/dL (ref 1.5–4.5)
Glucose: 103 mg/dL — ABNORMAL HIGH (ref 70–99)
Potassium: 4.1 mmol/L (ref 3.5–5.2)
Sodium: 139 mmol/L (ref 134–144)
Total Protein: 6.6 g/dL (ref 6.0–8.5)
eGFR: 86 mL/min/{1.73_m2} (ref >59)

## 2023-03-07 NOTE — Telephone Encounter (Signed)
Pt dropped specimen today

## 2023-03-07 NOTE — Addendum Note (Signed)
Addended by: Janey Greaser D on: 03/07/2023 11:24 AM   Modules accepted: Orders

## 2023-03-09 LAB — URINE CULTURE

## 2023-03-12 NOTE — Progress Notes (Unsigned)
I,Melanie Franklin,acting as a scribe for [employer], MD.,have documented all relevant documentation on the behalf of Melanie Ramp, MD,as directed by  Melanie Ramp, MD while in the presence of Melanie Ramp, MD.   Established patient visit   Patient: Melanie Franklin   DOB: Jan 08, 1929   89 y.o. Female  MRN: [id number] Visit Date: 03/13/2023  Today's healthcare provider: [name], MD   Chief Complaint  Patient presents with   Back Pain   Subjective    Back Pain This is a chronic problem. Episode onset: off and on. The problem is unchanged. The pain is present in the lumbar spine (left side). The pain is The same all the time. Associated symptoms include bladder incontinence. She has tried heat for the symptoms.    She states that the pain sometimes wakes her up at night. The pain does not radiate. She has not had an xray recently on the back. She reports chronic problem with urinary frequency that occurred before the back pain.  Patient is accompanied by her son, Melanie Franklin, who helps to take her to appointments because the patient no longer drives. He requests that she not be on additional medications that can cause her to be dizzy.   Medications: Outpatient Medications Prior to Visit  Medication Sig   acetaminophen (TYLENOL) 160 MG/5ML suspension Take 1 tablet by mouth 2 (two) times daily.   acetaminophen (TYLENOL) 650 MG CR tablet Take 650 mg by mouth 2 (two) times daily. 1 tablet in the morning and 1 at bedtime   amLODipine (NORVASC) 2.5 MG tablet Take 1 tablet (2.5 mg total) by mouth in the morning.   aspirin 81 MG tablet Take 81 mg by mouth daily.    CALCIUM PO Take by mouth.   diclofenac Sodium (VOLTAREN) 1 % GEL Apply topically 4 (four) times daily.   docusate sodium (COLACE) 100 MG capsule Take 1 capsule every day by oral route.   hydrochlorothiazide (HYDRODIURIL) 25 MG tablet    magnesium oxide (MAG-OX) 400 MG  tablet Take 400 mg by mouth daily.    MULTIPLE VITAMINS PO Take 1 tablet by mouth daily.    Multiple Vitamins-Minerals (ONE DAILY CALCIUM/IRON PO) Take 1 tablet by mouth daily.   omeprazole (PRILOSEC) 20 MG capsule Take 1 capsule (20 mg total) by mouth daily.   ondansetron (ZOFRAN) 4 MG tablet    OVER THE COUNTER MEDICATION Apply 1 application topically as needed (muscle and joint  pain). Horse Liniment Topical Analgesic 4% Natural Menthol   pravastatin (PRAVACHOL) 40 MG tablet Take 1 tablet (40 mg total) by mouth daily.   traMADol (ULTRAM) 50 MG tablet    No facility-administered medications prior to visit.    Review of Systems  Genitourinary:  Positive for bladder incontinence.  Musculoskeletal:  Positive for back pain.       Objective    BP (!) 171/77 (BP Location: Left Arm, Patient Position: Sitting, Cuff Size: Normal)   Pulse 80   Temp 98.5 F (36.9 C) (Oral)   Resp 16   Wt 116 lb 11.2 oz (52.9 kg)   BMI 24.39 kg/m    Physical Exam Vitals reviewed.  Constitutional:      General: She is not in acute distress.    Appearance: Normal appearance. She is not ill-appearing, toxic-appearing or diaphoretic.     Comments: Patient is ambulating with a cane, she does not appear to be in distress at time of visit   Eyes:  Conjunctiva/sclera: Conjunctivae normal.  Cardiovascular:     Rate and Rhythm: Normal rate and regular rhythm.     Pulses: Normal pulses.     Heart sounds: Normal heart sounds. No murmur heard.    No friction rub. No gallop.  Pulmonary:     Effort: Pulmonary effort is normal. No respiratory distress.     Breath sounds: Normal breath sounds. No stridor. No wheezing, rhonchi or rales.  Abdominal:     General: Bowel sounds are normal. There is no distension.     Palpations: Abdomen is soft.     Tenderness: There is no abdominal tenderness.  Musculoskeletal:       Back:     Right lower leg: No edema.     Left lower leg: No edema.  Skin:    Findings:  No erythema or rash.  Neurological:     Mental Status: She is alert and oriented to person, place, and time.       No results found for any visits on 03/13/23.  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        The entirety of the information documented in the History of Present Illness, Review of Systems and Physical Exam were personally obtained by me. Portions of this information were initially documented by Hetty Ely, CMA. I, Melanie Ramp, MD have reviewed the documentation above for thoroughness and accuracy.      Melanie Ramp, MD  [facility] [phone] (phone) [phone] (fax)  [facility]

## 2023-03-13 ENCOUNTER — Encounter: Payer: Self-pay | Admitting: Family Medicine

## 2023-03-13 ENCOUNTER — Ambulatory Visit (INDEPENDENT_AMBULATORY_CARE_PROVIDER_SITE_OTHER): Payer: Medicare Other | Admitting: Family Medicine

## 2023-03-13 VITALS — BP 171/77 | HR 80 | Temp 98.5°F | Resp 16 | Wt 116.7 lb

## 2023-03-13 DIAGNOSIS — G8929 Other chronic pain: Secondary | ICD-10-CM | POA: Diagnosis not present

## 2023-03-13 DIAGNOSIS — M545 Low back pain, unspecified: Secondary | ICD-10-CM

## 2023-03-13 NOTE — Assessment & Plan Note (Signed)
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

## 2023-03-13 NOTE — Patient Instructions (Addendum)
Please report to [facility] located at:  [address]  [place], [place] [address]  You do not need an appointment to have xrays completed.   Our office will follow up with  results once available.  Please continue to use your heating pad for your back pain in 20 min intervals

## 2023-04-01 ENCOUNTER — Other Ambulatory Visit: Payer: Self-pay | Admitting: Physician Assistant

## 2023-04-01 ENCOUNTER — Other Ambulatory Visit: Payer: Self-pay | Admitting: Family Medicine

## 2023-04-01 DIAGNOSIS — E7800 Pure hypercholesterolemia, unspecified: Secondary | ICD-10-CM

## 2023-04-01 DIAGNOSIS — I10 Essential (primary) hypertension: Secondary | ICD-10-CM

## 2023-04-02 NOTE — Telephone Encounter (Signed)
Requested Prescriptions  Pending Prescriptions Disp Refills   pravastatin (PRAVACHOL) 40 MG tablet [Pharmacy Med Name: PRAVASTATIN SODIUM 40 MG TAB] 90 tablet 2    Sig: TAKE 1 TABLET BY MOUTH DAILY     Cardiovascular:  Antilipid - Statins Failed - 04/01/2023 12:03 PM      Failed - Lipid Panel in normal range within the last 12 months    Cholesterol, Total  Date Value Ref Range Status  02/05/2022 172 100 - 199 mg/dL Final   LDL Chol Calc (NIH)  Date Value Ref Range Status  02/05/2022 75 0 - 99 mg/dL Final   HDL  Date Value Ref Range Status  02/05/2022 74 >39 mg/dL Final   Triglycerides  Date Value Ref Range Status  02/05/2022 137 0 - 149 mg/dL Final         Passed - Patient is not pregnant      Passed - Valid encounter within last 12 months    Recent Outpatient Visits           2 weeks ago Chronic left-sided low back pain without sciatica   [place] [facility] Simmons-Robinson, [place], MD   3 weeks ago Acute left-sided low back pain without sciatica   [place] [facility] Alfredia Ferguson, PA-C   2 months ago Essential (primary) hypertension   [place] [facility] Simmons-Robinson, [place], MD   2 months ago Essential (primary) hypertension   [facility] Simmons-Robinson, [place], MD   1 year ago Essential (primary) hypertension   [place] [facility] Bosie Clos, MD       Future Appointments             In 5 months Simmons-Robinson, Tawanna Cooler, MD [facility]

## 2023-05-07 DIAGNOSIS — I6523 Occlusion and stenosis of bilateral carotid arteries: Secondary | ICD-10-CM | POA: Diagnosis not present

## 2023-05-07 DIAGNOSIS — I872 Venous insufficiency (chronic) (peripheral): Secondary | ICD-10-CM | POA: Diagnosis not present

## 2023-05-07 DIAGNOSIS — I071 Rheumatic tricuspid insufficiency: Secondary | ICD-10-CM | POA: Diagnosis not present

## 2023-05-07 DIAGNOSIS — E782 Mixed hyperlipidemia: Secondary | ICD-10-CM | POA: Diagnosis not present

## 2023-05-07 DIAGNOSIS — I25810 Atherosclerosis of coronary artery bypass graft(s) without angina pectoris: Secondary | ICD-10-CM | POA: Diagnosis not present

## 2023-05-07 DIAGNOSIS — I10 Essential (primary) hypertension: Secondary | ICD-10-CM | POA: Diagnosis not present

## 2023-05-07 DIAGNOSIS — I340 Nonrheumatic mitral (valve) insufficiency: Secondary | ICD-10-CM | POA: Diagnosis not present

## 2023-05-07 DIAGNOSIS — Z950 Presence of cardiac pacemaker: Secondary | ICD-10-CM | POA: Diagnosis not present

## 2023-05-07 DIAGNOSIS — I442 Atrioventricular block, complete: Secondary | ICD-10-CM | POA: Diagnosis not present

## 2023-06-27 ENCOUNTER — Other Ambulatory Visit: Payer: Self-pay | Admitting: Family Medicine

## 2023-06-27 DIAGNOSIS — I10 Essential (primary) hypertension: Secondary | ICD-10-CM

## 2023-06-28 NOTE — Telephone Encounter (Signed)
Requested Prescriptions  Pending Prescriptions Disp Refills   amLODipine (NORVASC) 2.5 MG tablet [Pharmacy Med Name: AMLODIPINE BESYLATE 2.5 MG TAB] 90 tablet 0    Sig: TAKE 1 TABLET BY MOUTH EVERY MORNING     Cardiovascular: Calcium Channel Blockers 2 Failed - 06/27/2023  6:21 AM      Failed - Last BP in normal range    BP Readings from Last 1 Encounters:  03/13/23 (!) 171/77         Passed - Last Heart Rate in normal range    Pulse Readings from Last 1 Encounters:  03/13/23 80         Passed - Valid encounter within last 6 months    Recent Outpatient Visits           3 months ago Chronic left-sided low back pain without sciatica   [place] [facility] Simmons-Robinson, [place], MD   3 months ago Acute left-sided low back pain without sciatica   [place] [facility] Alfredia Ferguson, PA-C   5 months ago Essential (primary) hypertension   [place] [facility] Simmons-Robinson, [place], MD   5 months ago Essential (primary) hypertension   [place] [facility] Simmons-Robinson, [place], MD   1 year ago Essential (primary) hypertension   [place] [facility] Bosie Clos, MD       Future Appointments             In 2 months Simmons-Robinson, Tawanna Cooler, MD [facility]

## 2023-06-30 DIAGNOSIS — M62830 Muscle spasm of back: Secondary | ICD-10-CM | POA: Diagnosis not present

## 2023-07-02 DIAGNOSIS — I442 Atrioventricular block, complete: Secondary | ICD-10-CM | POA: Diagnosis not present

## 2023-07-09 ENCOUNTER — Emergency Department
Admission: EM | Admit: 2023-07-09 | Discharge: 2023-07-09 | Disposition: A | Discharge status: Home / Self Care | Payer: Medicare Other | Attending: Emergency Medicine | Admitting: Emergency Medicine

## 2023-07-09 ENCOUNTER — Other Ambulatory Visit: Payer: Self-pay

## 2023-07-09 ENCOUNTER — Emergency Department: Payer: Medicare Other

## 2023-07-09 ENCOUNTER — Encounter: Payer: Self-pay | Admitting: Emergency Medicine

## 2023-07-09 DIAGNOSIS — M5137 Other intervertebral disc degeneration, lumbosacral region: Secondary | ICD-10-CM | POA: Diagnosis not present

## 2023-07-09 DIAGNOSIS — M549 Dorsalgia, unspecified: Secondary | ICD-10-CM | POA: Diagnosis not present

## 2023-07-09 DIAGNOSIS — M5136 Other intervertebral disc degeneration, lumbar region: Secondary | ICD-10-CM | POA: Diagnosis not present

## 2023-07-09 DIAGNOSIS — M47816 Spondylosis without myelopathy or radiculopathy, lumbar region: Secondary | ICD-10-CM | POA: Diagnosis not present

## 2023-07-09 DIAGNOSIS — M4316 Spondylolisthesis, lumbar region: Secondary | ICD-10-CM | POA: Diagnosis not present

## 2023-07-09 DIAGNOSIS — G8929 Other chronic pain: Secondary | ICD-10-CM | POA: Diagnosis not present

## 2023-07-09 DIAGNOSIS — R29898 Other symptoms and signs involving the musculoskeletal system: Secondary | ICD-10-CM | POA: Diagnosis not present

## 2023-07-09 DIAGNOSIS — M5459 Other low back pain: Secondary | ICD-10-CM | POA: Diagnosis not present

## 2023-07-09 DIAGNOSIS — M438X6 Other specified deforming dorsopathies, lumbar region: Secondary | ICD-10-CM | POA: Diagnosis not present

## 2023-07-09 DIAGNOSIS — M4696 Unspecified inflammatory spondylopathy, lumbar region: Secondary | ICD-10-CM | POA: Diagnosis not present

## 2023-07-09 DIAGNOSIS — I10 Essential (primary) hypertension: Secondary | ICD-10-CM | POA: Diagnosis not present

## 2023-07-09 DIAGNOSIS — M47817 Spondylosis without myelopathy or radiculopathy, lumbosacral region: Secondary | ICD-10-CM

## 2023-07-09 LAB — URINALYSIS, ROUTINE W REFLEX MICROSCOPIC
Bilirubin Urine: NEGATIVE
Glucose, UA: NEGATIVE mg/dL
Hgb urine dipstick: NEGATIVE
Ketones, ur: 20 mg/dL — AB
Leukocytes,Ua: NEGATIVE
Nitrite: NEGATIVE
Protein, ur: 30 mg/dL — AB
Specific Gravity, Urine: 1.012 (ref 1.005–1.030)
pH: 7 (ref 5.0–8.0)

## 2023-07-09 MED ORDER — ONDANSETRON 4 MG PO TBDP
4.0000 mg | ORAL_TABLET | Freq: Three times a day (TID) | ORAL | 0 refills | Status: DC | PRN
Start: 2023-07-09 — End: 2023-07-16

## 2023-07-09 MED ORDER — TRAMADOL HCL 50 MG PO TABS: 50.0000 mg | ORAL_TABLET | Freq: Three times a day (TID) | ORAL | 0 refills | Status: DC | PRN

## 2023-07-09 MED ORDER — TRAMADOL HCL 50 MG PO TABS
50.0000 mg | ORAL_TABLET | Freq: Once | ORAL | Status: AC
  Administered 2023-07-09: 50 mg via ORAL
  Filled 2023-07-09: qty 1

## 2023-07-09 MED ORDER — ONDANSETRON 4 MG PO TBDP
4.0000 mg | ORAL_TABLET | Freq: Once | ORAL | Status: AC
  Administered 2023-07-09: 4 mg via ORAL
  Filled 2023-07-09: qty 1

## 2023-07-09 NOTE — Discharge Instructions (Addendum)
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  This is the cause of your mechanical back pain.  No evidence of any fractures on exam.  You should take the prescription medicines as directed.  Consider follow-up with primary provider for ongoing evaluation and management of your low back pain.  Be open to having nursing support in the home as your mobility declines.  Return to the ED if needed.

## 2023-07-09 NOTE — TOC CM/SW Note (Signed)
Cm received consultation for home health aid services. Pt needs more at home services for private pay. Son agreed for cm to make outreach to [facility] for in home services. Cm sent referral to [facility] with [facility] to give son a call.

## 2023-07-09 NOTE — ED Provider Notes (Signed)
[facility] Emergency Department Provider Note     Event Date/Time   First MD Initiated Contact with Patient 07/09/23 1154     (approximate)   History   Back Pain   HPI  Melanie Franklin is a 89 y.o. female presents to the ED via EMS from home.  Patient would endorse acute on chronic back pain.  Patient lives independently with her adult children living nearby.  She presents to the ED with her adult son at bedtime who gives some remote history.  She would endorse at least 2 months of midline lower back pain with symptoms intermittently worse today.  She has been evaluated by her primary provider and Ortho for the same complaint.  She has been given instructions to take OTC Tylenol as her chart review reveals a sensitivity to tramadol noted as abdominal pain.  Patient would endorse pain is increased with activity including changing positions and rolling over in bed.  She denies any recent injury, trauma, or fall.  She denies any bladder or bowel incontinence, foot drop, or saddle anesthesias.  Has voiced some concern to me related to his mom's ability to remain in the home independently as she is still apparently making her her own healthcare decisions at this time.  She is resistant to leave her home, also has expressed to the family that she has no desire to have caregivers in the home full-time.  She is apparently to have her initial visit today by her nurse aide was expected to spend 2 hours with her today and 2 hours on Thursday.   Physical Exam   Triage Vital Signs: ED Triage Vitals  Encounter Vitals Group     BP 07/09/23 1105 (!) 170/93     Systolic BP Percentile --      Diastolic BP Percentile --      Pulse Rate 07/09/23 1105 (!) 103     Resp 07/09/23 1105 18     Temp 07/09/23 1105 98.4 F (36.9 C)     Temp Source 07/09/23 1105 Oral     SpO2 07/09/23 1105 92 %     Weight 07/09/23 1107 117 lb (53.1 kg)     Height 07/09/23 1107 4\' 10"  (1.473 m)     Head  Circumference --      Peak Flow --      Pain Score 07/09/23 1106 4     Pain Loc --      Pain Education --      Exclude from Growth Chart --     Most recent vital signs: Vitals:   07/09/23 1105 07/09/23 1626  BP: (!) 170/93 (!) 175/90  Pulse: (!) 103 99  Resp: 18 18  Temp: 98.4 F (36.9 C) 98.3 F (36.8 C)  SpO2: 92% 93%    General Awake, no distress. NAD HEENT NCAT. PERRL. EOMI. No rhinorrhea. Mucous membranes are moist. CV:  Good peripheral perfusion. RRR RESP:  Normal effort. CTA ABD:  No distention. Soft, nontender MSK:  Normal spinal alignment with some midline tenderness palpation of the lumbosacral junction.  Bony changes consistent with scoliosis, mild kyphosis, and likely degenerative changes of the lumbar spine.  Patient transitions actively with some standby assist secondary to pain from supine to sit and sit to stand.  Normal flexion range of the hips and knees bilaterally. NEURO: Cranial nerves II to XII grossly intact.  Normal LE DTRs bilaterally.  ED Results / Procedures / Treatments   Labs (all  labs ordered are listed, but only abnormal results are displayed) Labs Reviewed  URINALYSIS, ROUTINE W REFLEX MICROSCOPIC - Abnormal; Notable for the following components:      Result Value   Color, Urine YELLOW (*)    APPearance CLOUDY (*)    Ketones, ur 20 (*)    Protein, ur 30 (*)    Bacteria, UA RARE (*)    All other components within normal limits     EKG  RADIOLOGY  I personally viewed and evaluated these images as part of my medical decision making, as well as reviewing the written report by the radiologist.  ED Provider Interpretation: Chronic degenerative changes of the lumbar sacral spine including facet arthropathy, DDD, spondylolisthesis.  DG Lumbar Spine Complete  Result Date: 07/09/2023 CLINICAL DATA:  Chronic low back pain EXAM: LUMBAR SPINE - COMPLETE 4+ VIEW COMPARISON:  None Available. FINDINGS: Bones are osteopenic. No acute fractures are  seen. There is 2 mm of anterolisthesis at L4-L5 which is favored is degenerative. There is moderate severe disc space narrowing throughout all lumbar levels compatible with degenerative change. There are degenerative changes of facet joints at L4-L5 and L5-S1. There is mild levoconvex curvature of the mid lumbar spine. There are atherosclerotic calcifications of the aorta. IMPRESSION: 1. Moderate to severe degenerative disc disease throughout the lumbar spine. 2. Degenerative changes of facet joints at L4-L5 and L5-S1. Electronically Signed   By: Darliss Cheney M.D.   On: 07/09/2023 16:11     PROCEDURES:  Critical Care performed: No  Procedures   MEDICATIONS ORDERED IN ED: Medications  ondansetron (ZOFRAN-ODT) disintegrating tablet 4 mg (4 mg Oral Given 07/09/23 1308)  traMADol (ULTRAM) tablet 50 mg (50 mg Oral Given 07/09/23 1308)  traMADol (ULTRAM) tablet 50 mg (50 mg Oral Given 07/09/23 1624)     IMPRESSION / MDM / ASSESSMENT AND PLAN / ED COURSE  I reviewed the triage vital signs and the nursing notes.                              Differential diagnosis includes, but is not limited to, lumbar strain, DDD, facet arthropathy, compression fracture  Patient's presentation is most consistent with acute complicated illness / injury requiring diagnostic workup.  Patient's diagnosis is consistent with acute on chronic pain secondary to underlying severe DDD and facet arthropathy of the lumbosacral spine.  With otherwise reassuring exam and workup at this time.  Patient endorsing significant discomfort in the midline spine but has been treated with p.o. medication without previously reported side effect of abdominal pain.  Discussed at length the patient's unique position and that she lives independently and has not to this point, had a detrimental fracture.  Patient's son is hoping to encourage her to consider home health care and/or transitioning to an assisted living facility.  Patient extremely  resistant to leaving her home at this time.  Son is given contact information for home health agency that may be able to provide some additional coverage at home.  Patient will be discharged home with prescriptions for Ultram and Zofran. Patient is to follow up with her primary provider as needed or otherwise directed. Patient is given ED precautions to return to the ED for any worsening or new symptoms.   FINAL CLINICAL IMPRESSION(S) / ED DIAGNOSES   Final diagnoses:  Chronic midline low back pain without sciatica  DDD (degenerative disc disease), lumbosacral  Facet arthritis of lumbosacral region  Rx / DC Orders   ED Discharge Orders          Ordered    ondansetron (ZOFRAN-ODT) 4 MG disintegrating tablet  Every 8 hours PRN        07/09/23 1624    traMADol (ULTRAM) 50 MG tablet  3 times daily with meals PRN        07/09/23 1624             Note:  This document was prepared using Dragon voice recognition software and may include unintentional dictation errors.    Lissa Hoard, PA-C 07/09/23 1939    Trinna Post, MD 07/11/23 [phone]

## 2023-07-09 NOTE — ED Triage Notes (Signed)
Patient to ED via [facility] from home for back pain x2 months. Seen PCP and ortho for same. Intermittent pain but worse today. No new injuries.

## 2023-07-15 ENCOUNTER — Telehealth: Payer: Self-pay

## 2023-07-15 NOTE — Transitions of Care (Post Inpatient/ED Visit) (Signed)
07/15/2023  Name: Melanie Franklin MRN: [id number] DOB: October 26, 1929  Today's TOC FU Call Status: Today's TOC FU Call Status:: Successful TOC FU Call Completed Unsuccessful Call (1st Attempt) Date: 07/15/23 [facility] FU Call Complete Date: 07/15/23 Patient's Name and Date of Birth confirmed.  Red on EMMI-ED Discharge Alert Date & Reason:07/11/23 "Scheduled follow-up appt? No"  Transition Care Management Follow-up Telephone Call Date of Discharge: 07/09/23 Discharge Facility: [facility]) Type of Discharge: Inpatient Admission Reason for ED Visit: Other: ("chronic midline low back pain without sciatica") How have you been since you were released from the hospital?: Same (call completed wtih son-Michael. Pt lives her on her own. Son states pt continues to have pain/discomfort. She is having to use a walker which she hadn't needed for some time.) Any questions or concerns?: Yes Patient Questions/Concerns:: Son vocies tht pt will run out of pain med-Tramdol in a few days-wants ED MD to refill med Patient Questions/Concerns Addressed: Other: (Advsied son that all refills will need to come from PCP as once discharged from the ED-the ED MD does not continue to refill and prescribe meds for pt. Son voices understanding and aware that pt needs appt wtih provider to be seen &evaluated.)  Items Reviewed: Did you receive and understand the discharge instructions provided?: Yes Medications obtained,verified, and reconciled?: Partial Review Completed Reason for Partial Mediation Review: sx mgmt meds reviewed-son nto with pt or meds at present Any new allergies since your discharge?: No Dietary orders reviewed?: NA Do you have support at home?: Yes People in Home: alone Name of Support/Comfort Primary Source: son lives nearby and assist pt as needed  Medications Reviewed Today: Medications Reviewed Today     Reviewed by Charlyn Minerva, RN (Registered Nurse) on  07/15/23 at [phone]  Med List Status: <None>   Medication Order Taking? Sig Documenting Provider Last Dose Status Informant  acetaminophen (TYLENOL) 160 MG/5ML suspension [id number]  Take 1 tablet by mouth 2 (two) times daily. [provider]  Active   acetaminophen (TYLENOL) 650 MG CR tablet [id number] Yes Take 650 mg by mouth 2 (two) times daily. 1 tablet in the morning and 1 at bedtime [provider] Taking Active Self           Med Note Juel Burrow   Wed Jun 26, 2017 11:16 AM)    amLODipine (NORVASC) 2.5 MG tablet [id number]  TAKE 1 TABLET BY MOUTH EVERY MORNING Simmons-Robinson, Makiera, MD  Active   aspirin 81 MG tablet [id number]  Take 81 mg by mouth daily.  [provider]  Active Self           Med Note Leim Fabry Jun 26, 2017 11:16 AM)    CALCIUM PO [id number]  Take by mouth. [provider]  Active   diclofenac Sodium (VOLTAREN) 1 % GEL [id number] Yes Apply topically 4 (four) times daily. [provider] Taking Active   docusate sodium (COLACE) 100 MG capsule [id number]  Take 1 capsule every day by oral route. [provider]  Active   hydrochlorothiazide (HYDRODIURIL) 25 MG tablet [id number]   [provider]  Active   magnesium oxide (MAG-OX) 400 MG tablet [id number]  Take 400 mg by mouth daily.  [provider]  Active Self           Med Note Leim Fabry Jun 26, 2017 11:16 AM)    MULTIPLE VITAMINS PO [id number]  Take 1 tablet by  mouth daily.  [provider]  Active Self           Med Note Christell Constant, Bronson Curb Jun 26, 2017 11:16 AM)    Multiple Vitamins-Minerals (ONE DAILY CALCIUM/IRON PO) [id number]  Take 1 tablet by mouth daily. [provider]  Active Self  omeprazole (PRILOSEC) 20 MG capsule [id number]  Take 1 capsule (20 mg total) by mouth daily. Alfredia Ferguson, PA-C  Active   ondansetron (ZOFRAN-ODT) 4 MG disintegrating tablet [id number]  Take 1 tablet (4 mg total)  by mouth every 8 (eight) hours as needed for nausea or vomiting. Lissa Hoard, PA-C  Active   OVER THE COUNTER MEDICATION [id number]  Apply 1 application topically as needed (muscle and joint  pain). Horse Liniment Topical Analgesic 4% Natural Menthol [provider]  Active Self  pravastatin (PRAVACHOL) 40 MG tablet [id number]  TAKE 1 TABLET BY MOUTH DAILY Simmons-Robinson, Makiera, MD  Active   traMADol (ULTRAM) 50 MG tablet [id number] Yes Take 1-2 tablets (50-100 mg total) by mouth 3 (three) times daily with meals as needed. Menshew, Charlesetta Ivory, PA-C Taking Active             Home Care and Equipment/Supplies: Were Home Health Services Ordered?: NA Any new equipment or medical supplies ordered?: NA  Functional Questionnaire: Do you need assistance with bathing/showering or dressing?: No Do you need assistance with meal preparation?: No Do you need assistance with eating?: No Do you have difficulty maintaining continence: No Do you need assistance with getting out of bed/getting out of a chair/moving?: No Do you have difficulty managing or taking your medications?: No  Follow up appointments reviewed: PCP Follow-up appointment confirmed?: Yes Date of PCP follow-up appointment?: 07/16/23 Follow-up Provider: [facility] Follow-up appointment confirmed?: NA Do you need transportation to your follow-up appointment?: No Do you understand care options if your condition(s) worsen?: Yes-patient verbalized understanding  SDOH Interventions Today    Flowsheet Row Most Recent Value  SDOH Interventions   Food Insecurity Interventions Intervention Not Indicated  Transportation Interventions Intervention Not Indicated       Alessandra Grout [facility] Care Management Care Management Community Coordinator Direct Phone: [phone] Toll Free: [phone] Fax: [phone]

## 2023-07-16 ENCOUNTER — Encounter: Payer: Self-pay | Admitting: Family Medicine

## 2023-07-16 ENCOUNTER — Telehealth (INDEPENDENT_AMBULATORY_CARE_PROVIDER_SITE_OTHER): Payer: Medicare Other | Admitting: Family Medicine

## 2023-07-16 ENCOUNTER — Telehealth: Payer: Self-pay

## 2023-07-16 DIAGNOSIS — G8929 Other chronic pain: Secondary | ICD-10-CM

## 2023-07-16 DIAGNOSIS — M545 Low back pain, unspecified: Secondary | ICD-10-CM

## 2023-07-16 MED ORDER — ONDANSETRON 4 MG PO TBDP: 4.0000 mg | ORAL_TABLET | Freq: Three times a day (TID) | ORAL | 2 refills | Status: AC | PRN

## 2023-07-16 MED ORDER — TRAMADOL HCL 50 MG PO TABS: 50.0000 mg | ORAL_TABLET | Freq: Three times a day (TID) | ORAL | 0 refills | Status: DC | PRN

## 2023-07-16 NOTE — Progress Notes (Signed)
MyChart Video Visit    Virtual Visit via Video Note   This format is felt to be most appropriate for this patient at this time. Physical exam was limited by quality of the video and audio technology used for the visit.   Patient location: Patient's home address   Provider location: [facility]  [address]  [place], [place] [address]   I discussed the limitations of evaluation and management by telemedicine and the availability of in person appointments. The patient expressed understanding and agreed to proceed.  Patient: Melanie Franklin   DOB: 11-03-1929   89 y.o. Female  MRN: [id number] Visit Date: 07/16/2023  Today's healthcare provider: [name], MD   Chief Complaint  Patient presents with   Follow-up    Patient's son reports she is taking tramadol, naproxen, and using lidocain patches. Reports some improvement with pain. They would like to know if patches can be used everyday.    Subjective    HPI HPI     Follow-up    Additional comments: Patient's son reports she is taking tramadol, naproxen, and using lidocain patches. Reports some improvement with pain. They would like to know if patches can be used everyday.       Last edited by Myles Lipps, CMA on 07/16/2023 11:01 AM.       TOC call 07/15/23 ED Discharge 07/09/23  Information reviewed in patient's EMR for summary of ED visit  Patient underwent lumbar xray that showed Moderate to severe degenerative disc disease throughout the lumbar spine. 2. Degenerative changes of facet joints at L4-L5 and L5-S1.    Discussed the use of AI scribe software for clinical note transcription with the patient, who gave verbal consent to proceed.  History of Present Illness   Melanie Franklin, a patient with a history of degenerative back changes and arthropathy, has been experiencing persistent back pain. Despite being prescribed tramadol 50 mg, the pain has not been completely  alleviated, although it has been somewhat mitigated. The patient has been taking one tablet of tramadol with breakfast and another with supper daily. There have been no reported instances of numbness or weakness in the legs, nor any accidents involving urine or feces.  In addition to the tramadol, the patient has been using lidocaine patches and a heating pad for pain management. The lidocaine patches are applied at night and removed in the morning, providing some relief through the night. During the day, when the patch is not in use, the patient lays on a heating pad, which also seems to provide significant relief.  The patient has previously consulted with an orthopedic specialist, who suggested the pain was due to arthritis and muscle-related issues. The specialist recommended the use of lidocaine patches, which have been incorporated into the patient's pain management routine.  The patient's family has been considering the possibility of transitioning her to a skilled nursing facility if her mobility continues to decline due to the persistent pain. The patient's brother has been assisting with her care and medication management.         Medications: Outpatient Medications Prior to Visit  Medication Sig   acetaminophen (TYLENOL) 650 MG CR tablet Take 650 mg by mouth 2 (two) times daily. 1 tablet in the morning and 1 at bedtime   amLODipine (NORVASC) 2.5 MG tablet TAKE 1 TABLET BY MOUTH EVERY MORNING   aspirin 81 MG tablet Take 81 mg by mouth daily.    CALCIUM PO Take  600 mg by mouth daily in the afternoon.   lidocaine 4 % Place 1 patch onto the skin every 12 (twelve) hours.   magnesium oxide (MAG-OX) 400 MG tablet Take 400 mg by mouth daily.    Multiple Vitamins-Minerals (ONE DAILY CALCIUM/IRON PO) Take 1 tablet by mouth daily.   naproxen (NAPROSYN) 500 MG tablet Take 220 mg by mouth 2 (two) times daily with a meal.   omeprazole (PRILOSEC) 20 MG capsule Take 1 capsule (20 mg total) by mouth  daily.   pravastatin (PRAVACHOL) 40 MG tablet TAKE 1 TABLET BY MOUTH DAILY   [DISCONTINUED] ondansetron (ZOFRAN-ODT) 4 MG disintegrating tablet Take 1 tablet (4 mg total) by mouth every 8 (eight) hours as needed for nausea or vomiting.   [DISCONTINUED] traMADol (ULTRAM) 50 MG tablet Take 1-2 tablets (50-100 mg total) by mouth 3 (three) times daily with meals as needed.   hydrochlorothiazide (HYDRODIURIL) 25 MG tablet    [DISCONTINUED] acetaminophen (TYLENOL) 160 MG/5ML suspension Take 1 tablet by mouth 2 (two) times daily.   [DISCONTINUED] diclofenac Sodium (VOLTAREN) 1 % GEL Apply topically 4 (four) times daily. (Patient not taking: Reported on 07/16/2023)   [DISCONTINUED] docusate sodium (COLACE) 100 MG capsule Take 1 capsule every day by oral route.   [DISCONTINUED] MULTIPLE VITAMINS PO Take 1 tablet by mouth daily.  (Patient not taking: Reported on 07/16/2023)   [DISCONTINUED] OVER THE COUNTER MEDICATION Apply 1 application topically as needed (muscle and joint  pain). Horse Liniment Topical Analgesic 4% Natural Menthol   No facility-administered medications prior to visit.    Review of Systems       Objective    There were no vitals taken for this visit.       Physical Exam  Unable to evaluation pt due to problems with connecting for virtual visit    Assessment & Plan     Problem List Items Addressed This Visit     Chronic left-sided low back pain without sciatica - Primary   Relevant Medications   naproxen (NAPROSYN) 500 MG tablet   traMADol (ULTRAM) 50 MG tablet       Chronic Back Pain Persistent despite conservative management with heat therapy and lidocaine patches. Tramadol 50mg  BID provides some relief. No signs of nerve compression. Discussed potential referral to orthopedics for further evaluation and possible steroid injections. -Continue Tramadol 50mg  BID, can increase to TID if needed. -Refill Zofran as needed for nausea associated with Tramadol. -Consider  referral to orthopedics for further evaluation and management.  General Health Maintenance / Followup Plans -Schedule follow-up appointment in December 2024.        No follow-ups on file.     I discussed the assessment and treatment plan with the patient. The patient was provided an opportunity to ask questions and all were answered. The patient agreed with the plan and demonstrated an understanding of the instructions.   The patient was advised to call back or seek an in-person evaluation if the symptoms worsen or if the condition fails to improve as anticipated.  I provided 17 minutes of non-face-to-face time during this encounter.   [name], MD [facility] [phone] (phone) [phone] (fax)  [facility]

## 2023-07-16 NOTE — Telephone Encounter (Signed)
Copied from CRM [id number]. Topic: Appointment Scheduling - Scheduling Inquiry for Clinic >> Jul 15, 2023 11:27 AM Franchot Heidelberg wrote: Reason for CRM: Pt's son called reporting that the patient may not be able to make it into the office. Pt's son is wondering if pt could have a phone call appt because she is almost out of her current supply of Tramadol. >> Jul 15, 2023 11:46 AM Reeves Forth wrote: Melanie Franklin - this was routed to a provider directly - please check to ensure provider is aware/reviews >> Jul 15, 2023 11:33 AM Dondra Prader E wrote: Please call Melanie Franklin (son) at [phone]

## 2023-07-23 ENCOUNTER — Ambulatory Visit (INDEPENDENT_AMBULATORY_CARE_PROVIDER_SITE_OTHER): Payer: Medicare Other

## 2023-07-23 VITALS — Ht <= 58 in | Wt 117.0 lb

## 2023-07-23 DIAGNOSIS — Z0000 Encounter for general adult medical examination without abnormal findings: Secondary | ICD-10-CM

## 2023-07-23 NOTE — Patient Instructions (Signed)
Ms. Debell , Thank you for taking time to come for your Medicare Wellness Visit. I appreciate your ongoing commitment to your health goals. Please review the following plan we discussed and let me know if I can assist you in the future.   Referrals/Orders/Follow-Ups/Clinician Recommendations: none  This is a list of the screening recommended for you and due dates:  Health Maintenance  Topic Date Due   Zoster (Shingles) Vaccine (1 of 2) 10/09/1981   DTaP/Tdap/Td vaccine (2 - Tdap) 02/08/2014   Flu Shot  06/20/2023   COVID-19 Vaccine (2 - 2023-24 season) 07/21/2023   Medicare Annual Wellness Visit  07/22/2024   Pneumonia Vaccine  Completed   DEXA scan (bone density measurement)  Completed   HPV Vaccine  Aged Out    Advanced directives: (Copy Requested) Please bring a copy of your health care power of attorney and living will to the office to be added to your chart at your convenience.  Next Medicare Annual Wellness Visit scheduled for next year: Yes 07/27/2024 @ 10:45am telephone

## 2023-07-23 NOTE — Progress Notes (Signed)
Subjective:   Melanie Franklin is a 89 y.o. female who presents for Medicare Annual (Subsequent) preventive examination.  Visit Complete: Virtual  I connected with  Jeanmarie Hubert on 07/23/23 by a audio enabled telemedicine application and verified that I am speaking with the correct person using two identifiers.  Patient Location: Home  Provider Location: Office/Clinic  I discussed the limitations of evaluation and management by telemedicine. The patient expressed understanding and agreed to proceed.  Vital Signs: Unable to obtain new vitals due to this being a telehealth visit.  Patient Medicare AWV questionnaire was completed by the patient on (not done); I have confirmed that all information answered by patient is correct and no changes since this date.  Review of Systems    Cardiac Risk Factors include: advanced age (>25men, >11 women);hypertension;dyslipidemia;sedentary lifestyle    Objective:    Today's Vitals   07/23/23 1053  Weight: 117 lb (53.1 kg)  Height: 4\' 10"  (1.473 m)   Body mass index is 24.45 kg/m.     07/23/2023   11:12 AM 07/09/2023   11:07 AM 07/18/2022    2:08 PM 04/26/2020    9:30 PM 07/29/2018   10:21 AM 04/22/2018   10:19 AM 07/12/2017   11:21 AM  Advanced Directives  Does Patient Have a Medical Advance Directive? Yes No No Yes No Yes Yes  Type of Estate agent of [place];Living will   Living will  Healthcare Power of [place];Living will Healthcare Power of [place];Living will  Does patient want to make changes to medical advance directive?       No - Patient declined  Copy of Healthcare Power of Attorney in Chart?      No - copy requested No - copy requested  Would patient like information on creating a medical advance directive?   No - Patient declined        Current Medications (verified) Outpatient Encounter Medications as of 07/23/2023  Medication Sig   acetaminophen (TYLENOL) 650 MG CR tablet Take 650 mg by mouth 2 (two) times  daily. 1 tablet in the morning and 1 at bedtime   amLODipine (NORVASC) 2.5 MG tablet TAKE 1 TABLET BY MOUTH EVERY MORNING   aspirin 81 MG tablet Take 81 mg by mouth daily.    CALCIUM PO Take 600 mg by mouth daily in the afternoon.   hydrochlorothiazide (HYDRODIURIL) 25 MG tablet    lidocaine 4 % Place 1 patch onto the skin every 12 (twelve) hours.   magnesium oxide (MAG-OX) 400 MG tablet Take 400 mg by mouth daily.    Multiple Vitamins-Minerals (ONE DAILY CALCIUM/IRON PO) Take 1 tablet by mouth daily.   naproxen (NAPROSYN) 500 MG tablet Take 220 mg by mouth 2 (two) times daily with a meal.   omeprazole (PRILOSEC) 20 MG capsule Take 1 capsule (20 mg total) by mouth daily.   ondansetron (ZOFRAN-ODT) 4 MG disintegrating tablet Take 1 tablet (4 mg total) by mouth every 8 (eight) hours as needed for nausea or vomiting.   pravastatin (PRAVACHOL) 40 MG tablet TAKE 1 TABLET BY MOUTH DAILY   traMADol (ULTRAM) 50 MG tablet Take 1-2 tablets (50-100 mg total) by mouth 3 (three) times daily with meals as needed.   No facility-administered encounter medications on file as of 07/23/2023.    Allergies (verified) Ciprofloxacin, Meloxicam, Tramadol, Amoxicillin, and Entex lq  [phenylephrine-guaifenesin]   History: Past Medical History:  Diagnosis Date   Coronary artery disease    Family history of adverse  reaction to anesthesia    "sister had a reaction to meds given during a heart cath that almost killed her."   GERD (gastroesophageal reflux disease)    Hypertension    Presence of permanent cardiac pacemaker    Past Surgical History:  Procedure Laterality Date   CARDIAC CATHETERIZATION     CATARACT EXTRACTION Bilateral    CORONARY ARTERY BYPASS GRAFT  03/09/2009   x 4 [facility]   ESOPHAGEAL DILATION     KNEE ARTHROSCOPY Left    LASIK     PACEMAKER INSERTION     PACEMAKER INSERTION Left 07/12/2017   Procedure: PACEMAKER CHANGE OUT;  Surgeon: Sharion Settler, MD;  Location:  [facility] ORS;  Service: Cardiovascular;  Laterality: Left;   TONSILLECTOMY     Family History  Problem Relation Age of Onset   Hypertension Mother    Colon cancer Mother    Cerebrovascular Accident Mother    Ulcers Mother    Other Mother        nervous breakdown   Stroke Father    Congestive Heart Failure Sister    Kidney Stones Sister    Cancer Sister        oral   Hypertension Brother    Hypertension Sister    Diabetes Sister    CAD Sister    Other Sister    Breast cancer Neg Hx    Social History   Socioeconomic History   Marital status: Widowed    Spouse name: Not on file   Number of children: 2   Years of education: Not on file   Highest education level: 12th grade  Occupational History   Not on file  Tobacco Use   Smoking status: Never   Smokeless tobacco: Never  Vaping Use   Vaping status: Never Used  Substance and Sexual Activity   Alcohol use: No   Drug use: No   Sexual activity: Not on file  Other Topics Concern   Not on file  Social History Narrative   Not on file   Social Determinants of Health   Financial Resource Strain: Low Risk  (07/18/2022)   Overall Financial Resource Strain (CARDIA)    Difficulty of Paying Living Expenses: Not hard at all  Food Insecurity: No Food Insecurity (07/15/2023)   Hunger Vital Sign    Worried About Running Out of Food in the Last Year: Never true    Ran Out of Food in the Last Year: Never true  Transportation Needs: No Transportation Needs (07/15/2023)   PRAPARE - Administrator, Civil Service (Medical): No    Lack of Transportation (Non-Medical): No  Physical Activity: Inactive (07/23/2023)   Exercise Vital Sign    Days of Exercise per Week: 0 days    Minutes of Exercise per Session: 0 min  Stress: No Stress Concern Present (07/23/2023)   [employer] of Occupational Health - Occupational Stress Questionnaire    Feeling of Stress : Not at all  Social Connections: Socially Isolated (07/18/2022)    Social Connection and Isolation Panel [NHANES]    Frequency of Communication with Friends and Family: More than three times a week    Frequency of Social Gatherings with Friends and Family: Once a week    Attends Religious Services: Never    Database administrator or Organizations: No    Attends Banker Meetings: Never    Marital Status: Widowed    Tobacco Counseling Counseling given: Not Answered  Clinical Intake:  Pre-visit preparation completed: No  Pain : No/denies pain     BMI - recorded: 24.45 Nutritional Status: BMI of 19-24  Normal Nutritional Risks: None Diabetes: No  How often do you need to have someone help you when you read instructions, pamphlets, or other written materials from your doctor or pharmacy?: 1 - Never  Interpreter Needed?: No  Comments: lives alone Information entered by :: B.Samyria Rudie,LPN   Activities of Daily Living    07/23/2023   11:12 AM 01/21/2023   11:03 AM  In your present state of health, do you have any difficulty performing the following activities:  Hearing? 0 1  Vision? 0 1  Difficulty concentrating or making decisions? 1 1  Walking or climbing stairs? 1 1  Dressing or bathing? 0 1  Doing errands, shopping? 1 1  Preparing Food and eating ? Y   Using the Toilet? N   In the past six months, have you accidently leaked urine? Y   Do you have problems with loss of bowel control? N   Managing your Finances? Y   Housekeeping or managing your Housekeeping? Y     Patient Care Team: [name], MD as PCP - General (Family Medicine) Dingeldein, Viviann Spare, MD as Consulting Physician (Ophthalmology) Lamar Blinks, MD as Consulting Physician (Cardiology)  Indicate any recent Medical Services you may have received from other than [facility] providers in the past year (date may be approximate).     Assessment:   This is a routine wellness examination for Burns.  Hearing/Vision screen Hearing Screening -  Comments:: Adequate hearing:little loss Vision Screening - Comments:: Adequate vision w/glasses Dr Dellie [place]   Dietary issues and exercise activities discussed:     Goals Addressed             This Visit's Progress    DIET - EAT MORE FRUITS AND VEGETABLES   On track    Increase water intake   On track    Recommend increasing water intake to 4-5 glasses a day.       Depression Screen    07/23/2023   11:09 AM 03/13/2023    9:31 AM 01/21/2023   11:02 AM 01/11/2023   10:32 AM 07/18/2022    2:01 PM 01/15/2022   11:31 AM 10/04/2020   10:20 AM  PHQ 2/9 Scores  PHQ - 2 Score 2 2 2  0 0 0   PHQ- 9 Score 5 8 12 5 1 6    Exception Documentation       Patient refusal    Fall Risk    07/23/2023   11:06 AM 01/21/2023   11:02 AM 01/11/2023   10:32 AM 07/18/2022    2:10 PM 01/15/2022   11:30 AM  Fall Risk   Falls in the past year? 0 0 0 0 0  Number falls in past yr: 0 0 0 0   Injury with Fall? 0 0 0 0 1  Risk for fall due to : No Fall Risks No Fall Risks  No Fall Risks   Follow up Falls prevention discussed;Education provided Falls evaluation completed  Falls prevention discussed     MEDICARE RISK AT HOME: Medicare Risk at Home Any stairs in or around the home?: Yes (does not climb) If so, are there any without handrails?: Yes Home free of loose throw rugs in walkways, pet beds, electrical cords, etc?: Yes Adequate lighting in your home to reduce risk of falls?: Yes Life alert?: Yes Use of a cane, walker  or w/c?: Yes Grab bars in the bathroom?: Yes Shower chair or bench in shower?: Yes Elevated toilet seat or a handicapped toilet?: No  TIMED UP AND GO:  Was the test performed?  No    Cognitive Function:        07/18/2022    2:13 PM 03/21/2017    9:52 AM  6CIT Screen  What Year? 0 points 0 points  What month? 0 points 0 points  What time? 0 points 0 points  Count back from 20 0 points 0 points  Months in reverse 2 points 0 points  Repeat phrase 6 points 6 points  Total  Score 8 points 6 points    Immunizations Immunization History  Administered Date(s) Administered   Fluad Quad(high Dose 65+) 09/30/2019, 09/13/2020, 01/15/2022   Influenza Whole 08/14/2016, 08/19/2017   Influenza, High Dose Seasonal PF 08/01/2015, 08/23/2018   PFIZER(Purple Top)SARS-COV-2 Vaccination 10/25/2020   Pneumococcal Conjugate-13 10/04/2014   Pneumococcal Polysaccharide-23 09/22/2005   Td 02/09/2004   Zoster, Live 08/09/2008    TDAP status: Up to date  Flu Vaccine status: Up to date  Pneumococcal vaccine status: Up to date  Covid-19 vaccine status: Completed vaccines  Qualifies for Shingles Vaccine? Yes   Zostavax completed No   Shingrix Completed?: No.    Education has been provided regarding the importance of this vaccine. Patient has been advised to call insurance company to determine out of pocket expense if they have not yet received this vaccine. Advised may also receive vaccine at local pharmacy or Health Dept. Verbalized acceptance and understanding.  Screening Tests Health Maintenance  Topic Date Due   Zoster Vaccines- Shingrix (1 of 2) 10/09/1981   DTaP/Tdap/Td (2 - Tdap) 02/08/2014   INFLUENZA VACCINE  06/20/2023   COVID-19 Vaccine (2 - 2023-24 season) 07/21/2023   Medicare Annual Wellness (AWV)  07/22/2024   Pneumonia Vaccine 72+ Years old  Completed   DEXA SCAN  Completed   HPV VACCINES  Aged Out    Health Maintenance  Health Maintenance Due  Topic Date Due   Zoster Vaccines- Shingrix (1 of 2) 10/09/1981   DTaP/Tdap/Td (2 - Tdap) 02/08/2014   INFLUENZA VACCINE  06/20/2023   COVID-19 Vaccine (2 - 2023-24 season) 07/21/2023    Colorectal cancer screening: No longer required.   Mammogram status: No longer required due to age.   Lung Cancer Screening: (Low Dose CT Chest recommended if Age 63-80 years, 20 pack-year currently smoking OR have quit w/in 15years.) does not qualify.   Lung Cancer Screening Referral: no  Additional  Screening:  Hepatitis C Screening: does not qualify; Completed yes  Vision Screening: Recommended annual ophthalmology exams for early detection of glaucoma and other disorders of the eye. Is the patient up to date with their annual eye exam?  Yes  Who is the provider or what is the name of the office in which the patient attends annual eye exams? Dr Dellie Burns If pt is not established with a provider, would they like to be referred to a provider to establish care? No .   Dental Screening: Recommended annual dental exams for proper oral hygiene  Diabetic Foot Exam: n/a  Community Resource Referral / Chronic Care Management: CRR required this visit?  No   CCM required this visit?  No    Plan:     I have personally reviewed and noted the following in the patient's chart:   Medical and social history Use of alcohol, tobacco or illicit drugs  Current medications and  supplements including opioid prescriptions. Patient is not currently taking opioid prescriptions. Functional ability and status Nutritional status Physical activity Advanced directives List of other physicians Hospitalizations, surgeries, and ER visits in previous 12 months Vitals Screenings to include cognitive, depression, and falls Referrals and appointments  In addition, I have reviewed and discussed with patient certain preventive protocols, quality metrics, and best practice recommendations. A written personalized care plan for preventive services as well as general preventive health recommendations were provided to patient.     Sue Lush, LPN   11/24/1094   After Visit Summary: (MyChart) Due to this being a telephonic visit, the after visit summary with patients personalized plan was offered to patient via MyChart   Nurse Notes: Pt states she is doing alright. She just started with HHA 3 times weekly in home to assist with "whatever she needs". Her son also on the call manages her home and  mychart/medical appts, etc. They has no concerns or questions other than explaining the visit purpose (collecting wellness information and addressing needs (to MD) and to provide a more focused appt with MD).

## 2023-08-29 ENCOUNTER — Other Ambulatory Visit: Payer: Self-pay | Admitting: Family Medicine

## 2023-08-29 NOTE — Telephone Encounter (Signed)
Requested medication (s) are due for refill today: yes  Requested medication (s) are on the active medication list: yes  Last refill:  07/16/23 #60/0  Future visit scheduled: yes  Notes to clinic:  Unable to refill per protocol, cannot delegate.      Requested Prescriptions  Pending Prescriptions Disp Refills   traMADol (ULTRAM) 50 MG tablet [Pharmacy Med Name: traMADol HCL 50MG  TABLET] 60 tablet     Sig: TAKE 1-2 TABLETS BY MOUTH THREE TIMES A DAY WITH MEALS AS NEEDED     Not Delegated - Analgesics:  Opioid Agonists Failed - 08/29/2023  3:15 PM      Failed - This refill cannot be delegated      Failed - Urine Drug Screen completed in last 360 days      Passed - Valid encounter within last 3 months    Recent Outpatient Visits           1 month ago Chronic left-sided low back pain without sciatica   [place] [facility] Simmons-Robinson, [place], MD   5 months ago Chronic left-sided low back pain without sciatica   [place] [facility] Allen, [place], MD   5 months ago Acute left-sided low back pain without sciatica   [facility] Alfredia Ferguson, PA-C   7 months ago Essential (primary) hypertension   [facility] [employer] [facility] Simmons-Robinson, [place], MD   7 months ago Essential (primary) hypertension    [facility] Simmons-Robinson, Financial controller, MD       Future Appointments             In 2 weeks Simmons-Robinson, Tawanna Cooler, MD [facility]

## 2023-09-12 ENCOUNTER — Ambulatory Visit: Payer: Medicare Other | Admitting: Family Medicine

## 2023-09-18 ENCOUNTER — Ambulatory Visit: Payer: Medicare Other | Admitting: Family Medicine

## 2023-09-18 NOTE — Progress Notes (Deleted)
      Established patient visit   Patient: Melanie Franklin   DOB: 07/17/1929   89 y.o. Female  MRN: [id number] Visit Date: 09/18/2023  Today's healthcare provider: [name], MD   No chief complaint on file.  Subjective       Discussed the use of AI scribe software for clinical note transcription with the patient, who gave verbal consent to proceed.  History of Present Illness             Past Medical History:  Diagnosis Date   Coronary artery disease    Family history of adverse reaction to anesthesia    "sister had a reaction to meds given during a heart cath that almost killed her."   GERD (gastroesophageal reflux disease)    Hypertension    Presence of permanent cardiac pacemaker     Medications: Outpatient Medications Prior to Visit  Medication Sig   acetaminophen (TYLENOL) 650 MG CR tablet Take 650 mg by mouth 2 (two) times daily. 1 tablet in the morning and 1 at bedtime   amLODipine (NORVASC) 2.5 MG tablet TAKE 1 TABLET BY MOUTH EVERY MORNING   aspirin 81 MG tablet Take 81 mg by mouth daily.    CALCIUM PO Take 600 mg by mouth daily in the afternoon.   hydrochlorothiazide (HYDRODIURIL) 25 MG tablet    lidocaine 4 % Place 1 patch onto the skin every 12 (twelve) hours.   magnesium oxide (MAG-OX) 400 MG tablet Take 400 mg by mouth daily.    Multiple Vitamins-Minerals (ONE DAILY CALCIUM/IRON PO) Take 1 tablet by mouth daily.   naproxen (NAPROSYN) 500 MG tablet Take 220 mg by mouth 2 (two) times daily with a meal.   omeprazole (PRILOSEC) 20 MG capsule Take 1 capsule (20 mg total) by mouth daily.   ondansetron (ZOFRAN-ODT) 4 MG disintegrating tablet Take 1 tablet (4 mg total) by mouth every 8 (eight) hours as needed for nausea or vomiting.   pravastatin (PRAVACHOL) 40 MG tablet TAKE 1 TABLET BY MOUTH DAILY   traMADol (ULTRAM) 50 MG tablet Take 1-2 tablets (50-100 mg total) by mouth every 8 (eight) hours as needed.   No facility-administered medications  prior to visit.    Review of Systems  {Insert previous labs (optional):23779} {See past labs  Heme  Chem  Endocrine  Serology  Results Review (optional):1}   Objective    There were no vitals taken for this visit. {Insert last BP/Wt (optional):23777}{See vitals history (optional):1}    Wt Readings from Last 3 Encounters:  07/23/23 117 lb (53.1 kg)  07/09/23 117 lb (53.1 kg)  03/13/23 116 lb 11.2 oz (52.9 kg)   BP Readings from Last 3 Encounters:  07/09/23 (!) 175/90  03/13/23 (!) 171/77  03/06/23 (!) 158/80     Physical Exam  ***  No results found for any visits on 09/18/23.  Assessment & Plan     Problem List Items Addressed This Visit   None   Assessment and Plan              No follow-ups on file.         [name], MD  [facility] [phone] (phone) [phone] (fax)  [facility]

## 2023-09-23 ENCOUNTER — Other Ambulatory Visit: Payer: Self-pay | Admitting: Family Medicine

## 2023-09-23 DIAGNOSIS — I10 Essential (primary) hypertension: Secondary | ICD-10-CM

## 2023-09-26 ENCOUNTER — Ambulatory Visit: Payer: Medicare Other | Admitting: Family Medicine

## 2023-09-27 ENCOUNTER — Other Ambulatory Visit: Payer: Self-pay | Admitting: Family Medicine

## 2023-09-27 NOTE — Telephone Encounter (Signed)
Requested medication (s) are due for refill today - yes  Requested medication (s) are on the active medication list -yes  Future visit scheduled -no  Last refill: 08/30/23 #60  Notes to clinic: non delegated Rx  Requested Prescriptions  Pending Prescriptions Disp Refills   traMADol (ULTRAM) 50 MG tablet [Pharmacy Med Name: traMADol HCL 50MG  TABLET] 60 tablet     Sig: TAKE ONE TO TWO TABLETS BY MOUTH EVERY 8 HOURS AS NEEDED     Not Delegated - Analgesics:  Opioid Agonists Failed - 09/27/2023  1:02 PM      Failed - This refill cannot be delegated      Failed - Urine Drug Screen completed in last 360 days      Passed - Valid encounter within last 3 months    Recent Outpatient Visits           2 months ago Chronic left-sided low back pain without sciatica   [facility] Simmons-Robinson, [place], MD   6 months ago Chronic left-sided low back pain without sciatica   [facility] [place], [place], MD   6 months ago Acute left-sided low back pain without sciatica   [place] [facility] Alfredia Ferguson, PA-C   8 months ago Essential (primary) hypertension   [place] [facility] Simmons-Robinson, [place], MD   8 months ago Essential (primary) hypertension   [place] [facility] Simmons-Robinson, [place], MD                 Requested Prescriptions  Pending Prescriptions Disp Refills   traMADol (ULTRAM) 50 MG tablet [Pharmacy Med Name: traMADol HCL 50MG  TABLET] 60 tablet     Sig: TAKE ONE TO TWO TABLETS BY MOUTH EVERY 8 HOURS AS NEEDED     Not Delegated - Analgesics:  Opioid Agonists Failed - 09/27/2023  1:02 PM      Failed - This refill cannot be delegated      Failed - Urine Drug Screen completed in last 360 days      Passed - Valid encounter within last 3 months    Recent Outpatient Visits           2 months ago Chronic left-sided low back pain without  sciatica   [facility] Simmons-Robinson, [place], MD   6 months ago Chronic left-sided low back pain without sciatica   [place] [facility] [place], [place], MD   6 months ago Acute left-sided low back pain without sciatica   [place] [facility] Alfredia Ferguson, PA-C   8 months ago Essential (primary) hypertension   [place] [facility] Simmons-Robinson, [place], MD   8 months ago Essential (primary) hypertension    [facility] [place], Tawanna Cooler, MD

## 2023-09-30 ENCOUNTER — Other Ambulatory Visit: Payer: Self-pay | Admitting: Family Medicine

## 2023-09-30 ENCOUNTER — Telehealth: Payer: Self-pay | Admitting: Family Medicine

## 2023-09-30 NOTE — Telephone Encounter (Signed)
Pharmacy did not receive, says Print.

## 2023-09-30 NOTE — Telephone Encounter (Signed)
Medication Refill -  Most Recent Primary Care Visit:  Provider: Sue Lush  Department: [facility]  Visit Type: MEDICARE AWV, SEQUENTIAL  Date: 07/23/2023  Medication: traMADol (ULTRAM) 50 MG tablet [[id number]]   Has the patient contacted their pharmacy? Yes (Agent: If no, request that the patient contact the pharmacy for the refill. If patient does not wish to contact the pharmacy document the reason why and proceed with request.) (Agent: If yes, when and what did the pharmacy advise?)  Is this the correct pharmacy for this prescription? Yes If no, delete pharmacy and type the correct one.  This is the patient's preferred pharmacy:  [facility] [id number] [place], [place] - [address] [place] [address] Phone: [phone] Fax: [phone]   Has the prescription been filled recently? Yes  Is the patient out of the medication? Yes  Has the patient been seen for an appointment in the last year OR does the patient have an upcoming appointment? Yes  Can we respond through MyChart? No  Agent: Please be advised that Rx refills may take up to 3 business days. We ask that you follow-up with your pharmacy.  Pharmacy doesn't have prescription

## 2023-09-30 NOTE — Telephone Encounter (Signed)
Medication Refill -  Most Recent Primary Care Visit:  Provider: Sue Lush  Department: [facility]  Visit Type: MEDICARE AWV, SEQUENTIAL  Date: 07/23/2023  Medication:  Tramadol, 50 mg  [name] , pts daughter in law called in requesting 90 day supply of tramadol 50 mg, because takes, 2 a day and she when she runs out the pain gets really bad  Has the patient contacted their pharmacy? yes  Is this the correct pharmacy for this prescription? yes If no, delete pharmacy and type the correct one.  This is the patient's preferred pharmacy:  [facility] [id number] [place], [place] - [address] [place] [address] Phone: [phone] Fax: [phone]   Has the prescription been filled recently? no  Is the patient out of the medication? yes  Has the patient been seen for an appointment in the last year OR does the patient have an upcoming appointment? yes  Can we respond through MyChart? yes  Agent: Please be advised that Rx refills may take up to 3 business days. We ask that you follow-up with your pharmacy.

## 2023-10-01 MED ORDER — TRAMADOL HCL 50 MG PO TABS: 50.0000 mg | ORAL_TABLET | Freq: Three times a day (TID) | ORAL | 0 refills | Status: DC | PRN

## 2023-10-01 NOTE — Telephone Encounter (Signed)
Requested medication (s) are due for refill today -no  Requested medication (s) are on the active medication list -yes  Future visit scheduled -no  Last refill: 10/01/23 #60  Notes to clinic: Request for 90 day supply- see note- non delegated Rx  Requested Prescriptions  Pending Prescriptions Disp Refills   traMADol (ULTRAM) 50 MG tablet [Pharmacy Med Name: traMADol HCL 50MG  TABLET] 60 tablet     Sig: TAKE ONE TO TWO TABLETS BY MOUTH EVERY 8 HOURS AS NEEDED     Not Delegated - Analgesics:  Opioid Agonists Failed - 09/30/2023 11:08 AM      Failed - This refill cannot be delegated      Failed - Urine Drug Screen completed in last 360 days      Passed - Valid encounter within last 3 months    Recent Outpatient Visits           2 months ago Chronic left-sided low back pain without sciatica   [place] [facility] Simmons-Robinson, [place], MD   6 months ago Chronic left-sided low back pain without sciatica   [place] [facility] Pitman, [place], MD   6 months ago Acute left-sided low back pain without sciatica   [place] [facility] Alfredia Ferguson, PA-C   8 months ago Essential (primary) hypertension   [place] [facility] Simmons-Robinson, [place], MD   8 months ago Essential (primary) hypertension   [place] [facility] Simmons-Robinson, [place], MD                 Requested Prescriptions  Pending Prescriptions Disp Refills   traMADol (ULTRAM) 50 MG tablet [Pharmacy Med Name: traMADol HCL 50MG  TABLET] 60 tablet     Sig: TAKE ONE TO TWO TABLETS BY MOUTH EVERY 8 HOURS AS NEEDED     Not Delegated - Analgesics:  Opioid Agonists Failed - 09/30/2023 11:08 AM      Failed - This refill cannot be delegated      Failed - Urine Drug Screen completed in last 360 days      Passed - Valid encounter within last 3 months    Recent Outpatient Visits           2 months ago  Chronic left-sided low back pain without sciatica   [place] [facility] Simmons-Robinson, [place], MD   6 months ago Chronic left-sided low back pain without sciatica   [place] [facility] Lonerock, [place], MD   6 months ago Acute left-sided low back pain without sciatica   [place] [facility] Alfredia Ferguson, PA-C   8 months ago Essential (primary) hypertension   [place] [facility] Simmons-Robinson, [place], MD   8 months ago Essential (primary) hypertension   [place] [facility] [place], Tawanna Cooler, MD

## 2023-10-01 NOTE — Telephone Encounter (Signed)
Rx resubmitted

## 2023-10-28 ENCOUNTER — Other Ambulatory Visit: Payer: Self-pay | Admitting: Family Medicine

## 2023-10-28 NOTE — Telephone Encounter (Signed)
Medication Refill -  Most Recent Primary Care Visit:  Provider: Sue Lush  Department: [facility]  Visit Type: MEDICARE AWV, SEQUENTIAL  Date: 07/23/2023  Medication:  traMADol (ULTRAM) 50 MG tablet    Has the patient contacted their pharmacy? Yes  Is this the correct pharmacy for this prescription? Yes  This is the patient's preferred pharmacy:  [facility] [id number] [place], [place] - [address] [place] [address] Phone: [phone] Fax: [phone]   Has the prescription been filled recently? Yes  Is the patient out of the medication? No  Has the patient been seen for an appointment in the last year OR does the patient have an upcoming appointment? Yes  Can we respond through MyChart? No  Agent: Please be advised that Rx refills may take up to 3 business days. We ask that you follow-up with your pharmacy.  Patients daughter in law would like a 90 day supply called in or have 2-3 refills so she does not have to call each month.

## 2023-10-29 NOTE — Telephone Encounter (Signed)
Requested medications are due for refill today.  yes  Requested medications are on the active medications list.  yes  Last refill. 10/01/2023 #60 0 rf  Future visit scheduled.   no  Notes to clinic.  Refill not delegated.    Requested Prescriptions  Pending Prescriptions Disp Refills   traMADol (ULTRAM) 50 MG tablet 60 tablet 0    Sig: Take 1-2 tablets (50-100 mg total) by mouth every 8 (eight) hours as needed.     Not Delegated - Analgesics:  Opioid Agonists Failed - 10/28/2023  5:36 PM      Failed - This refill cannot be delegated      Failed - Urine Drug Screen completed in last 360 days      Failed - Valid encounter within last 3 months    Recent Outpatient Visits           3 months ago Chronic left-sided low back pain without sciatica   [facility] Simmons-Robinson, [place], MD   7 months ago Chronic left-sided low back pain without sciatica   [place] [facility] [place], [place], MD   7 months ago Acute left-sided low back pain without sciatica   [facility] Alfredia Ferguson, PA-C   9 months ago Essential (primary) hypertension   [place] [facility] Simmons-Robinson, [place], MD   9 months ago Essential (primary) hypertension   [place] [facility] [place], Tawanna Cooler, MD

## 2023-10-30 MED ORDER — TRAMADOL HCL 50 MG PO TABS: 50.0000 mg | ORAL_TABLET | Freq: Three times a day (TID) | ORAL | 0 refills | Status: DC | PRN

## 2023-11-29 ENCOUNTER — Other Ambulatory Visit: Payer: Self-pay | Admitting: Family Medicine

## 2023-11-29 NOTE — Telephone Encounter (Signed)
 Refill request has been sent to PCP  Copied from CRM [id number]. Topic: General - Other >> Nov 29, 2023  8:44 AM Everette C wrote: Reason for CRM: The patient's daughter in law has called to request contact with a member of staff when possible  The patient has requested to have their traMADol  (ULTRAM ) 50 MG tablet [[id number]] refilled in 90 day quantities if possible  Please contact further when available

## 2023-12-19 ENCOUNTER — Other Ambulatory Visit: Payer: Self-pay | Admitting: Family Medicine

## 2023-12-19 DIAGNOSIS — I10 Essential (primary) hypertension: Secondary | ICD-10-CM

## 2023-12-24 ENCOUNTER — Telehealth: Payer: Self-pay | Admitting: Family Medicine

## 2023-12-24 DIAGNOSIS — E7800 Pure hypercholesterolemia, unspecified: Secondary | ICD-10-CM

## 2023-12-24 MED ORDER — PRAVASTATIN SODIUM 40 MG PO TABS: 40.0000 mg | ORAL_TABLET | Freq: Every day | ORAL | 0 refills | Status: DC

## 2023-12-24 NOTE — Telephone Encounter (Signed)
[facility] is requesting refill pravastatin (PRAVACHOL) 40 MG tablet   Please advise

## 2023-12-31 ENCOUNTER — Other Ambulatory Visit: Payer: Self-pay | Admitting: Physician Assistant

## 2023-12-31 DIAGNOSIS — K219 Gastro-esophageal reflux disease without esophagitis: Secondary | ICD-10-CM

## 2024-01-02 ENCOUNTER — Other Ambulatory Visit: Payer: Self-pay | Admitting: Family Medicine

## 2024-01-03 NOTE — Telephone Encounter (Signed)
Requested medication (s) are due for refill today: yes  Requested medication (s) are on the active medication list: yes  Last refill:  11/29/23  Future visit scheduled: no  Notes to clinic:  Unable to refill per protocol, cannot delegate.      Requested Prescriptions  Pending Prescriptions Disp Refills   traMADol (ULTRAM) 50 MG tablet [Pharmacy Med Name: TRAMADOL HCL 50MG  TABLET] 60 tablet     Sig: TAKE 1 TO 2 TABLETS BY MOUTH EVERY 12 HOURS AS NEEDED     Not Delegated - Analgesics:  Opioid Agonists Failed - 01/03/2024 11:44 AM      Failed - This refill cannot be delegated      Failed - Urine Drug Screen completed in last 360 days      Failed - Valid encounter within last 3 months    Recent Outpatient Visits           5 months ago Chronic left-sided low back pain without sciatica   [place] [facility] Simmons-Robinson, [place], MD   9 months ago Chronic left-sided low back pain without sciatica   [place] [facility] [place], [place], MD   10 months ago Acute left-sided low back pain without sciatica   [facility] Alfredia Ferguson, PA-C   11 months ago Essential (primary) hypertension   [place] [facility] Simmons-Robinson, [place], MD   11 months ago Essential (primary) hypertension   [place] [facility] Le[place]on, Tawanna Cooler, MD

## 2024-01-09 ENCOUNTER — Ambulatory Visit: Payer: Self-pay | Admitting: Family Medicine

## 2024-01-09 MED ORDER — TRAMADOL HCL 50 MG PO TABS: 50.0000 mg | ORAL_TABLET | Freq: Two times a day (BID) | ORAL | 0 refills | Status: DC | PRN

## 2024-01-09 NOTE — Telephone Encounter (Signed)
Copied from CRM [id number]. Topic: Clinical - Medication Question >> Jan 09, 2024  1:28 PM Tiffany B wrote: Reason for CRM: Caller was informed by pharmacy Tramadol refill was denied due to appointment being needed. Caller states her mother in law is 88 years old and its nearly impossible to get her out of the house. Caller states she works full time and her spouse is going through chemo. Patient will run out of medication this week and caller states when patient is without of medication her chronic pain will lead her into the hospital. Caller would like to discuss alternatives regarding visit options.

## 2024-01-09 NOTE — Telephone Encounter (Signed)
Reviewed. Tramadol RX sent to pharmacy. I recommend chronic follow via virtual appointment until she is able to return to clinic for labs follow up for BP and chronic pain meds

## 2024-01-10 NOTE — Telephone Encounter (Signed)
Spoke with patients son via cell phone. He reports that he is the main transportation for patient but has been dealing with treatments for a brain tumor recently diagnosed. Patient does not have any electronics to do a virtual visit. Patients son said he will call to make an appointment at a later date.

## 2024-01-10 NOTE — Telephone Encounter (Signed)
Contacted patient on home phone. Phone continued to ring and was unable to leave message

## 2024-02-05 ENCOUNTER — Telehealth: Payer: Self-pay

## 2024-02-05 ENCOUNTER — Other Ambulatory Visit: Payer: Self-pay | Admitting: Family Medicine

## 2024-02-05 MED ORDER — TRAMADOL HCL 50 MG PO TABS: 50.0000 mg | ORAL_TABLET | Freq: Two times a day (BID) | ORAL | 0 refills | Status: DC | PRN

## 2024-02-05 NOTE — Telephone Encounter (Signed)
 Copied from CRM [id number]. Topic: Clinical - Prescription Issue >> Feb 05, 2024  3:13 PM Geroge Baseman wrote: Reason for CRM: traMADol (ULTRAM) 50 MG tablet patient is needing a refill. The patient is not able to get out of the house easily and they would be much more efficient for her if she could get 60 day or 90 day refills. [phone], daughter in law, [place]. Please call to discuss if this would be an option as it is increasingly harder to get her out of her home for any appointments in her condition.

## 2024-02-12 DIAGNOSIS — Z950 Presence of cardiac pacemaker: Secondary | ICD-10-CM | POA: Diagnosis not present

## 2024-02-12 DIAGNOSIS — E782 Mixed hyperlipidemia: Secondary | ICD-10-CM | POA: Diagnosis not present

## 2024-02-12 DIAGNOSIS — G4733 Obstructive sleep apnea (adult) (pediatric): Secondary | ICD-10-CM | POA: Diagnosis not present

## 2024-02-12 DIAGNOSIS — I10 Essential (primary) hypertension: Secondary | ICD-10-CM | POA: Diagnosis not present

## 2024-02-12 DIAGNOSIS — I340 Nonrheumatic mitral (valve) insufficiency: Secondary | ICD-10-CM | POA: Diagnosis not present

## 2024-02-12 DIAGNOSIS — I25810 Atherosclerosis of coronary artery bypass graft(s) without angina pectoris: Secondary | ICD-10-CM | POA: Diagnosis not present

## 2024-02-12 DIAGNOSIS — I442 Atrioventricular block, complete: Secondary | ICD-10-CM | POA: Diagnosis not present

## 2024-02-12 DIAGNOSIS — I071 Rheumatic tricuspid insufficiency: Secondary | ICD-10-CM | POA: Diagnosis not present

## 2024-02-12 DIAGNOSIS — I6523 Occlusion and stenosis of bilateral carotid arteries: Secondary | ICD-10-CM | POA: Diagnosis not present

## 2024-03-18 ENCOUNTER — Other Ambulatory Visit: Payer: Self-pay | Admitting: Family Medicine

## 2024-03-18 DIAGNOSIS — E7800 Pure hypercholesterolemia, unspecified: Secondary | ICD-10-CM

## 2024-03-21 NOTE — Telephone Encounter (Signed)
 Requested medication (s) are due for refill today: Yes  Requested medication (s) are on the active medication list: Yes  Last refill:  12/24/23  Future visit scheduled: Yes  Notes to clinic:  Unable to refill per protocol due to failed labs, no updated results.      Requested Prescriptions  Pending Prescriptions Disp Refills   pravastatin  (PRAVACHOL ) 40 MG tablet [Pharmacy Med Name: PRAVASTATIN  SODIUM 40 MG TAB] 90 tablet 0    Sig: TAKE 1 TABLET BY MOUTH DAILY     Cardiovascular:  Antilipid - Statins Failed - 03/21/2024 12:35 AM      Failed - Valid encounter within last 12 months    Recent Outpatient Visits   None            Failed - Lipid Panel in normal range within the last 12 months    Cholesterol, Total  Date Value Ref Range Status  02/05/2022 172 100 - 199 mg/dL Final   LDL Chol Calc (NIH)  Date Value Ref Range Status  02/05/2022 75 0 - 99 mg/dL Final   HDL  Date Value Ref Range Status  02/05/2022 74 >39 mg/dL Final   Triglycerides  Date Value Ref Range Status  02/05/2022 137 0 - 149 mg/dL Final         Passed - Patient is not pregnant

## 2024-04-20 ENCOUNTER — Other Ambulatory Visit: Payer: Self-pay | Admitting: Family Medicine

## 2024-04-20 DIAGNOSIS — E7800 Pure hypercholesterolemia, unspecified: Secondary | ICD-10-CM

## 2024-04-23 ENCOUNTER — Other Ambulatory Visit: Payer: Self-pay | Admitting: Family Medicine

## 2024-04-23 NOTE — Telephone Encounter (Unsigned)
 Copied from CRM [id number]. Topic: Clinical - Medication Refill >> Apr 23, 2024  9:39 AM Zipporah Him wrote: Medication: traMADol  (ULTRAM ) 50 MG tablet   Has the patient contacted their pharmacy? Yes  They states to call office  This is the patient's preferred pharmacy:  [facility] [id number] [place], [place] - [address] [place] [address] Phone: [phone] Fax: [phone]  Is this the correct pharmacy for this prescription? Yes If no, delete pharmacy and type the correct one.   Has the prescription been filled recently? No  Is the patient out of the medication? No, will be out over the weekends  Has the patient been seen for an appointment in the last year OR does the patient have an upcoming appointment? Yes  Can we respond through MyChart? Yes  Agent: Please be advised that Rx refills may take up to 3 business days. We ask that you follow-up with your pharmacy.

## 2024-04-24 MED ORDER — TRAMADOL HCL 50 MG PO TABS: 50.0000 mg | ORAL_TABLET | Freq: Two times a day (BID) | ORAL | 0 refills | Status: AC | PRN

## 2024-04-24 NOTE — Telephone Encounter (Signed)
 Requested medication (s) are due for refill today:   Provider to review  Requested medication (s) are on the active medication list:   Yes  Future visit scheduled:   No.     Last ordered: 02/05/2024 #180, 0 refills  Non delegated refill    Requested Prescriptions  Pending Prescriptions Disp Refills   traMADol  (ULTRAM ) 50 MG tablet 180 tablet 0    Sig: Take 1-2 tablets (50-100 mg total) by mouth every 12 (twelve) hours as needed.     Not Delegated - Analgesics:  Opioid Agonists Failed - 04/24/2024 11:18 AM      Failed - This refill cannot be delegated      Failed - Urine Drug Screen completed in last 360 days      Failed - Valid encounter within last 3 months    Recent Outpatient Visits   None

## 2024-05-07 DIAGNOSIS — Z7689 Persons encountering health services in other specified circumstances: Secondary | ICD-10-CM | POA: Diagnosis not present

## 2024-05-07 DIAGNOSIS — I10 Essential (primary) hypertension: Secondary | ICD-10-CM | POA: Diagnosis not present

## 2024-05-07 DIAGNOSIS — Z1331 Encounter for screening for depression: Secondary | ICD-10-CM | POA: Diagnosis not present

## 2024-05-07 DIAGNOSIS — M150 Primary generalized (osteo)arthritis: Secondary | ICD-10-CM | POA: Diagnosis not present

## 2024-05-07 DIAGNOSIS — E782 Mixed hyperlipidemia: Secondary | ICD-10-CM | POA: Diagnosis not present

## 2024-05-07 DIAGNOSIS — M159 Polyosteoarthritis, unspecified: Secondary | ICD-10-CM | POA: Diagnosis not present

## 2024-05-07 DIAGNOSIS — Z79899 Other long term (current) drug therapy: Secondary | ICD-10-CM | POA: Diagnosis not present

## 2024-05-08 DIAGNOSIS — Z79899 Other long term (current) drug therapy: Secondary | ICD-10-CM | POA: Diagnosis not present

## 2024-05-18 ENCOUNTER — Other Ambulatory Visit: Payer: Self-pay | Admitting: Family Medicine

## 2024-05-18 DIAGNOSIS — E7800 Pure hypercholesterolemia, unspecified: Secondary | ICD-10-CM

## 2024-05-19 NOTE — Telephone Encounter (Signed)
 LOV 03/13/23 NOV 08/11/24 LRF 04/20/24 LABS 05/08/24

## 2024-06-02 DIAGNOSIS — I442 Atrioventricular block, complete: Secondary | ICD-10-CM | POA: Diagnosis not present

## 2024-06-16 ENCOUNTER — Other Ambulatory Visit: Payer: Self-pay | Admitting: Family Medicine

## 2024-06-16 DIAGNOSIS — I10 Essential (primary) hypertension: Secondary | ICD-10-CM

## 2024-08-11 ENCOUNTER — Ambulatory Visit (INDEPENDENT_AMBULATORY_CARE_PROVIDER_SITE_OTHER): Payer: Medicare Other

## 2024-08-11 DIAGNOSIS — Z0000 Encounter for general adult medical examination without abnormal findings: Secondary | ICD-10-CM | POA: Diagnosis not present

## 2024-08-11 NOTE — Patient Instructions (Addendum)
 Ms. Settle,  Thank you for taking the time for your Medicare Wellness Visit. I appreciate your continued commitment to your health goals. Please review the care plan we discussed, and feel free to reach out if I can assist you further.  Medicare recommends these wellness visits once per year to help you and your care team stay ahead of potential health issues. These visits are designed to focus on prevention, allowing your provider to concentrate on managing your acute and chronic conditions during your regular appointments.  Please note that Annual Wellness Visits do not include a physical exam. Some assessments may be limited, especially if the visit was conducted virtually. If needed, we may recommend a separate in-person follow-up with your provider.  Ongoing Care Seeing your primary care provider every 3 to 6 months helps us  monitor your health and provide consistent, personalized care.   Referrals If a referral was made during today's visit and you haven't received any updates within two weeks, please contact the referred provider directly to check on the status.  Recommended Screenings:  Health Maintenance  Topic Date Due   Zoster (Shingles) Vaccine (1 of 2) 10/09/1948   DTaP/Tdap/Td vaccine (2 - Tdap) 02/08/2014   COVID-19 Vaccine (2 - Pfizer risk series) 11/15/2020   Flu Shot  06/19/2024   Medicare Annual Wellness Visit  08/11/2025   Pneumococcal Vaccine for age over 58  Completed   DEXA scan (bone density measurement)  Completed   HPV Vaccine  Aged Out   Meningitis B Vaccine  Aged Out     Advance Care Planning is important because it: Ensures you receive medical care that aligns with your values, goals, and preferences. Provides guidance to your family and loved ones, reducing the emotional burden of decision-making during critical moments.  Vision: Annual vision screenings are recommended for early detection of glaucoma, cataracts, and diabetic retinopathy. These exams can  also reveal signs of chronic conditions such as diabetes and high blood pressure.  Dental: Annual dental screenings help detect early signs of oral cancer, gum disease, and other conditions linked to overall health, including heart disease and diabetes.  Please see the attached documents for additional preventive care recommendations.   NEXT 08/17/25 @ 10:50 AM BY PHONE

## 2024-08-11 NOTE — Progress Notes (Signed)
 Subjective:   Melanie Franklin is a 89 y.o. who presents for a Medicare Wellness preventive visit.  As a reminder, Annual Wellness Visits don't include a physical exam, and some assessments may be limited, especially if this visit is performed virtually. We may recommend an in-person follow-up visit with your provider if needed.  Visit Complete: Virtual I connected with  Melanie Franklin Mon on 08/11/24 by a audio enabled telemedicine application and verified that I am speaking with the correct person using two identifiers.  Patient Location: Home  Provider Location: Office/Clinic  I discussed the limitations of evaluation and management by telemedicine. The patient expressed understanding and agreed to proceed.  Vital Signs: Because this visit was a virtual/telehealth visit, some criteria may be missing or patient reported. Any vitals not documented were not able to be obtained and vitals that have been documented are patient reported.  VideoDeclined- This patient declined Librarian, academic. Therefore the visit was completed with audio only.  Persons Participating in Visit: Patient.  AWV Questionnaire: No: Patient Medicare AWV questionnaire was not completed prior to this visit.  Cardiac Risk Factors include: advanced age (>22men, >29 women);dyslipidemia;hypertension;sedentary lifestyle     Objective:    There were no vitals filed for this visit. There is no height or weight on file to calculate BMI.     08/11/2024   11:00 AM 07/23/2023   11:12 AM 07/09/2023   11:07 AM 07/18/2022    2:08 PM 04/26/2020    9:30 PM 07/29/2018   10:21 AM 04/22/2018   10:19 AM  Advanced Directives  Does Patient Have a Medical Advance Directive? No Yes No No Yes No  Yes   Type of Furniture conservator/restorer;Living will   Living will  Healthcare Power of [place];Living will  Copy of Healthcare Power of Attorney in Chart?       No - copy requested   Would patient  like information on creating a medical advance directive? No - Patient declined   No - Patient declined        Data saved with a previous flowsheet row definition    Current Medications (verified) Outpatient Encounter Medications as of 08/11/2024  Medication Sig   acetaminophen  (TYLENOL ) 650 MG CR tablet Take 650 mg by mouth 2 (two) times daily. 1 tablet in the morning and 1 at bedtime   amLODipine  (NORVASC ) 2.5 MG tablet TAKE 1 TABLET BY MOUTH EVERY MORNING   aspirin 81 MG tablet Take 81 mg by mouth daily.    CALCIUM PO Take 600 mg by mouth daily in the afternoon.   hydrochlorothiazide  (HYDRODIURIL ) 25 MG tablet    lidocaine  4 % Place 1 patch onto the skin every 12 (twelve) hours.   magnesium oxide (MAG-OX) 400 MG tablet Take 400 mg by mouth daily.    Multiple Vitamins-Minerals (ONE DAILY CALCIUM/IRON PO) Take 1 tablet by mouth daily.   naproxen (NAPROSYN) 500 MG tablet Take 220 mg by mouth 2 (two) times daily with a meal.   omeprazole  (PRILOSEC) 20 MG capsule TAKE 1 CAPSULE BY MOUTH DAILY   ondansetron  (ZOFRAN -ODT) 4 MG disintegrating tablet Take 1 tablet (4 mg total) by mouth every 8 (eight) hours as needed for nausea or vomiting.   pravastatin  (PRAVACHOL ) 40 MG tablet TAKE 1 TABLET BY MOUTH DAILY   traMADol  (ULTRAM ) 50 MG tablet Take 1-2 tablets (50-100 mg total) by mouth every 12 (twelve) hours as needed.   No facility-administered encounter medications on  file as of 08/11/2024.    Allergies (verified) Ciprofloxacin, Meloxicam, Tramadol , Amoxicillin, and Entex lq  [phenylephrine -guaifenesin]   History: Past Medical History:  Diagnosis Date   Coronary artery disease    Family history of adverse reaction to anesthesia    sister had a reaction to meds given during a heart cath that almost killed her.   GERD (gastroesophageal reflux disease)    Hypertension    Presence of permanent cardiac pacemaker    Past Surgical History:  Procedure Laterality Date   CARDIAC  CATHETERIZATION     CATARACT EXTRACTION Bilateral    CORONARY ARTERY BYPASS GRAFT  03/09/2009   x 4 [facility]   ESOPHAGEAL DILATION     KNEE ARTHROSCOPY Left    LASIK     PACEMAKER INSERTION     PACEMAKER INSERTION Left 07/12/2017   Procedure: PACEMAKER CHANGE OUT;  Surgeon: Debby Franky CROME, MD;  Location: [facility] ORS;  Service: Cardiovascular;  Laterality: Left;   TONSILLECTOMY     Family History  Problem Relation Age of Onset   Hypertension Mother    Colon cancer Mother    Cerebrovascular Accident Mother    Ulcers Mother    Other Mother        nervous breakdown   Stroke Father    Congestive Heart Failure Sister    Kidney Stones Sister    Cancer Sister        oral   Hypertension Brother    Hypertension Sister    Diabetes Sister    CAD Sister    Other Sister    Breast cancer Neg Hx    Social History   Socioeconomic History   Marital status: Widowed    Spouse name: Not on file   Number of children: 2   Years of education: Not on file   Highest education level: 12th grade  Occupational History   Not on file  Tobacco Use   Smoking status: Never   Smokeless tobacco: Never  Vaping Use   Vaping status: Never Used  Substance and Sexual Activity   Alcohol use: No   Drug use: No   Sexual activity: Not on file  Other Topics Concern   Not on file  Social History Narrative   Not on file   Social Drivers of Health   Financial Resource Strain: Low Risk  (08/11/2024)   Overall Financial Resource Strain (CARDIA)    Difficulty of Paying Living Expenses: Not hard at all  Food Insecurity: No Food Insecurity (08/11/2024)   Hunger Vital Sign    Worried About Running Out of Food in the Last Year: Never true    Ran Out of Food in the Last Year: Never true  Transportation Needs: No Transportation Needs (08/11/2024)   PRAPARE - Administrator, Civil Service (Medical): No    Lack of Transportation (Non-Medical): No  Physical Activity: Insufficiently  Active (08/11/2024)   Exercise Vital Sign    Days of Exercise per Week: 5 days    Minutes of Exercise per Session: 20 min  Stress: No Stress Concern Present (08/11/2024)   [employer] of Occupational Health - Occupational Stress Questionnaire    Feeling of Stress: Not at all  Social Connections: Socially Isolated (08/11/2024)   Social Connection and Isolation Panel    Frequency of Communication with Friends and Family: More than three times a week    Frequency of Social Gatherings with Friends and Family: Once a week    Attends  Religious Services: Never    Active Member of Clubs or Organizations: No    Attends Banker Meetings: Never    Marital Status: Widowed    Tobacco Counseling Counseling given: Not Answered    Clinical Intake:  Pre-visit preparation completed: Yes  Pain : No/denies pain     BMI - recorded: 24.5 Nutritional Status: BMI of 19-24  Normal Nutritional Risks: None Diabetes: No  No results found for: HGBA1C   How often do you need to have someone help you when you read instructions, pamphlets, or other written materials from your doctor or pharmacy?: 1 - Never  Interpreter Needed?: No  Information entered by :: Melanie DAS, LPN   Activities of Daily Living     08/11/2024   11:03 AM  In your present state of health, do you have any difficulty performing the following activities:  Hearing? 0  Vision? 0  Difficulty concentrating or making decisions? 0  Walking or climbing stairs? 1  Dressing or bathing? 0  Doing errands, shopping? 1  Preparing Food and eating ? N  Using the Toilet? N  In the past six months, have you accidently leaked urine? Y  Do you have problems with loss of bowel control? N  Managing your Medications? Y  Managing your Finances? Y  Housekeeping or managing your Housekeeping? Y    Patient Care Team: Sharma Coyer, MD as PCP - General (Family Medicine) Dingeldein, Elspeth, MD as Consulting  Physician (Ophthalmology) Hester Wolm PARAS, MD as Consulting Physician (Cardiology)  I have updated your Care Teams any recent Medical Services you may have received from other providers in the past year.     Assessment:   This is a routine wellness examination for Melanie Franklin.  Hearing/Vision screen Hearing Screening - Comments:: NO AIDS Vision Screening - Comments:: WEARS GLASSES ALL DAY- [facility]-NEEDS A VISIT    Goals Addressed             This Visit's Progress    Cut out extra servings         Depression Screen     08/11/2024   10:57 AM 07/23/2023   11:09 AM 03/13/2023    9:31 AM 01/21/2023   11:02 AM 01/11/2023   10:32 AM 07/18/2022    2:01 PM 01/15/2022   11:31 AM  PHQ 2/9 Scores  PHQ - 2 Score 0 2 2 2  0 0 0  PHQ- 9 Score 0 5 8 12 5 1 6     Fall Risk     08/11/2024   11:02 AM 07/23/2023   11:06 AM 01/21/2023   11:02 AM 01/11/2023   10:32 AM 07/18/2022    2:10 PM  Fall Risk   Falls in the past year? 0 0 0 0 0  Number falls in past yr: 0 0 0 0 0  Injury with Fall? 0 0 0 0 0  Risk for fall due to :  No Fall Risks No Fall Risks  No Fall Risks  Follow up Falls evaluation completed;Falls prevention discussed Falls prevention discussed;Education provided Falls evaluation completed  Falls prevention discussed      Data saved with a previous flowsheet row definition    MEDICARE RISK AT HOME:  Medicare Risk at Home Any stairs in or around the home?: Yes If so, are there any without handrails?: No Home free of loose throw rugs in walkways, pet beds, electrical cords, etc?: Yes Adequate lighting in your home to reduce risk of falls?: Yes Life alert?:  Yes Use of a cane, walker or w/c?: Yes (WALKER ALL THE TIME) Grab bars in the bathroom?: Yes Shower chair or bench in shower?: No Elevated toilet seat or a handicapped toilet?: Yes  TIMED UP AND GO:  Was the test performed?  No  Cognitive Function: 6CIT completed        08/11/2024   11:06 AM 07/18/2022    2:13 PM  03/21/2017    9:52 AM  6CIT Screen  What Year? 0 points 0 points 0 points  What month? 0 points 0 points 0 points  What time? 0 points 0 points 0 points  Count back from 20 0 points 0 points 0 points  Months in reverse 2 points 2 points 0 points  Repeat phrase 2 points 6 points 6 points  Total Score 4 points 8 points 6 points    Immunizations Immunization History  Administered Date(s) Administered   Fluad Quad(high Dose 65+) 09/30/2019, 09/13/2020, 01/15/2022   INFLUENZA, HIGH DOSE SEASONAL PF 08/01/2015, 08/23/2018   Influenza Whole 08/14/2016, 08/19/2017   PFIZER(Purple Top)SARS-COV-2 Vaccination 10/25/2020   Pneumococcal Conjugate-13 10/04/2014   Pneumococcal Polysaccharide-23 09/22/2005   Td 02/09/2004   Zoster, Live 08/09/2008    Screening Tests Health Maintenance  Topic Date Due   Zoster Vaccines- Shingrix (1 of 2) 10/09/1948   DTaP/Tdap/Td (2 - Tdap) 02/08/2014   COVID-19 Vaccine (2 - Pfizer risk series) 11/15/2020   Influenza Vaccine  06/19/2024   Medicare Annual Wellness (AWV)  08/11/2025   Pneumococcal Vaccine: 50+ Years  Completed   DEXA SCAN  Completed   HPV VACCINES  Aged Out   Meningococcal B Vaccine  Aged Out    Health Maintenance Items Addressed: NEEDS PNA, COVID; AGED OUT OF MAMMOGRAM, BDS & COLONOSCOPY  Additional Screening:  Vision Screening: Recommended annual ophthalmology exams for early detection of glaucoma and other disorders of the eye. Is the patient up to date with their annual eye exam?  Yes  Who is the provider or what is the name of the office in which the patient attends annual eye exams? [facility]  Dental Screening: Recommended annual dental exams for proper oral hygiene  Community Resource Referral / Chronic Care Management: CRR required this visit?  No   CCM required this visit?  No   Plan:    I have personally reviewed and noted the following in the patient's chart:   Medical and social history Use of alcohol, tobacco  or illicit drugs  Current medications and supplements including opioid prescriptions. Patient is not currently taking opioid prescriptions. Functional ability and status Nutritional status Physical activity Advanced directives List of other physicians Hospitalizations, surgeries, and ER visits in previous 12 months Vitals Screenings to include cognitive, depression, and falls Referrals and appointments  In addition, I have reviewed and discussed with patient certain preventive protocols, quality metrics, and best practice recommendations. A written personalized care plan for preventive services as well as general preventive health recommendations were provided to patient.   Melanie GORMAN Das, LPN   [id number]   After Visit Summary: (MyChart) Due to this being a telephonic visit, the after visit summary with patients personalized plan was offered to patient via MyChart   Notes: Nothing significant to report at this time.

## 2024-08-18 DIAGNOSIS — R634 Abnormal weight loss: Secondary | ICD-10-CM | POA: Diagnosis not present

## 2024-10-09 DIAGNOSIS — L600 Ingrowing nail: Secondary | ICD-10-CM | POA: Diagnosis not present

## 2024-10-09 DIAGNOSIS — M79675 Pain in left toe(s): Secondary | ICD-10-CM | POA: Diagnosis not present

## 2024-10-09 DIAGNOSIS — M79674 Pain in right toe(s): Secondary | ICD-10-CM | POA: Diagnosis not present

## 2024-10-09 DIAGNOSIS — B351 Tinea unguium: Secondary | ICD-10-CM | POA: Diagnosis not present

## 2025-08-17 ENCOUNTER — Ambulatory Visit
